# Patient Record
Sex: Male | Born: 1970 | Race: White | Hispanic: No | Marital: Married | Smoking: Never smoker
Health system: Southern US, Community
[De-identification: ages and names within clinical notes are randomized; demographics above are authoritative.]

## PROBLEM LIST (undated history)

## (undated) DIAGNOSIS — K219 Gastro-esophageal reflux disease without esophagitis: Secondary | ICD-10-CM

## (undated) DIAGNOSIS — L409 Psoriasis, unspecified: Secondary | ICD-10-CM

## (undated) DIAGNOSIS — M1990 Unspecified osteoarthritis, unspecified site: Secondary | ICD-10-CM

## (undated) DIAGNOSIS — I10 Essential (primary) hypertension: Secondary | ICD-10-CM

## (undated) DIAGNOSIS — E291 Testicular hypofunction: Secondary | ICD-10-CM

## (undated) HISTORY — DX: Psoriasis, unspecified: L40.9

## (undated) HISTORY — DX: Testicular hypofunction: E29.1

## (undated) HISTORY — PX: COLONOSCOPY: SHX174

## (undated) HISTORY — PX: OTHER SURGICAL HISTORY: SHX169

## (undated) HISTORY — DX: Morbid (severe) obesity due to excess calories: E66.01

## (undated) HISTORY — DX: Essential (primary) hypertension: I10

## (undated) HISTORY — DX: Gastro-esophageal reflux disease without esophagitis: K21.9

## (undated) HISTORY — PX: NO PAST SURGERIES: SHX2092

---

## 2001-09-01 ENCOUNTER — Encounter: Admission: RE | Admit: 2001-09-01 | Discharge: 2001-09-01 | Payer: Self-pay | Admitting: Occupational Medicine

## 2001-09-01 ENCOUNTER — Encounter: Payer: Self-pay | Admitting: Occupational Medicine

## 2005-04-24 ENCOUNTER — Emergency Department (HOSPITAL_COMMUNITY): Admission: EM | Admit: 2005-04-24 | Discharge: 2005-04-24 | Payer: Self-pay | Admitting: Family Medicine

## 2005-10-12 ENCOUNTER — Ambulatory Visit: Payer: Self-pay | Admitting: Family Medicine

## 2005-11-12 ENCOUNTER — Ambulatory Visit: Payer: Self-pay | Admitting: Family Medicine

## 2008-12-07 ENCOUNTER — Telehealth: Payer: Self-pay | Admitting: Family Medicine

## 2010-03-08 ENCOUNTER — Telehealth: Payer: Self-pay | Admitting: Family Medicine

## 2010-03-14 ENCOUNTER — Ambulatory Visit: Payer: Self-pay | Admitting: Family Medicine

## 2010-03-14 DIAGNOSIS — K219 Gastro-esophageal reflux disease without esophagitis: Secondary | ICD-10-CM | POA: Insufficient documentation

## 2010-03-14 DIAGNOSIS — N453 Epididymo-orchitis: Secondary | ICD-10-CM

## 2010-03-14 DIAGNOSIS — M109 Gout, unspecified: Secondary | ICD-10-CM | POA: Insufficient documentation

## 2010-03-14 DIAGNOSIS — M545 Low back pain: Secondary | ICD-10-CM

## 2010-03-29 ENCOUNTER — Telehealth: Payer: Self-pay | Admitting: Family Medicine

## 2010-03-29 DIAGNOSIS — N509 Disorder of male genital organs, unspecified: Secondary | ICD-10-CM | POA: Insufficient documentation

## 2010-04-05 ENCOUNTER — Encounter: Payer: Self-pay | Admitting: Family Medicine

## 2010-04-20 NOTE — Progress Notes (Signed)
Summary: referral to urologist   Phone Note Call from Patient   Caller: Patient Call For: Nelwyn Salisbury MD Summary of Call: [phone] Pt would like a referral to URO for an appt this week. He continues to have the same symptoms that he had on his last visit.   Initial call taken by: Lynann Beaver CMA AAMA,  March 29, 2010 10:43 AM  Follow-up for Phone Call        refer to Urology for low back pain and right testicle pain  Follow-up by: Nelwyn Salisbury MD,  March 30, 2010 10:23 AM  Additional Follow-up for Phone Call Additional follow up Details #1::        referral sent to terri  Additional Follow-up by: Pura Spice, RN,  March 30, 2010 11:51 AM  New Problems: TESTICULAR PAIN, RIGHT (ICD-608.9)   New Problems: TESTICULAR PAIN, RIGHT (ICD-608.9)

## 2010-04-20 NOTE — Progress Notes (Signed)
Summary: Pt req acute ov for Tues 12/27 for back pain and pain rt testicl  Phone Note Call from Patient Call back at [phone] cell   Caller: Patient Summary of Call: Pt called and said that he would like to sch and acute visit with Dr Clent Ridges on Tues 03/14/10 re: pain in lower back and in right testicle. Pt will be out of work that day. Pls advise.  Initial call taken by: Lucy Antigua,  March 08, 2010 1:58 PM  Follow-up for Phone Call        okay to schedule this Follow-up by: Nelwyn Salisbury MD,  March 08, 2010 5:14 PM  Additional Follow-up for Phone Call Additional follow up Details #1::        I called pt and sch him for acute ov for 12/27 at 8:30am, as noted above.  Additional Follow-up by: Lucy Antigua,  March 09, 2010 8:24 AM

## 2010-04-20 NOTE — Consult Note (Signed)
Summary: [facility]  [facility]   Imported By: Maryln Gottron 04/13/2010 13:32:57  _____________________________________________________________________  External Attachment:    Type:   Image     Comment:   External Document

## 2010-04-20 NOTE — Assessment & Plan Note (Signed)
Summary: lower back pain/pain in rt testicle/per Dr Fry/cjr   Vital Signs:  Patient profile:   40 year old male Height:      71.75 inches Weight:      312 pounds BMI:     42.76 Temp:     98.4 degrees F oral Pulse rate:   80 / minute Pulse rhythm:   regular Resp:     12 per minute BP sitting:   120 / 78  (left arm) Cuff size:   large  Vitals Entered By: Sid Falcon LPN (March 14, 2010 8:26 AM) CC: To re-establish Is Patient Diabetic? No   History of Present Illness: 40 yr old male to re-establish with [name] after an absence of 4 years. He has 2 complaints. First for 2 months he has had intermittent right testicular pain. This is mild, never severe. No urinary symptoms or fever. No penile DC. Also for one month he has had intermittent sharp right sided low back pain which radiates into both anterior thighs. No weakness or numbness. No recent trauma. He was diagnosed with gout about one year ago, and he has taken Indomethacin or prednisone for this. The gout usually hits his left foot and ankle. When this happens, the foot gets red and hot and swollen and very painful. Today it is fine. His [employer] involves him travelling all but 4 days a month, so he asks for a supply of gout meds for him to take with him.   Preventive Screening-Counseling & Management  Alcohol-Tobacco     Smoking Status: never  Allergies (verified): No Known Drug Allergies  Past History:  Past Medical History: morbid obesity, peaked at 425 lbs in 2006 chickenpox psoriasis since age 71 GERD Gout  Past Surgical History: Denies surgical history  Family History: Reviewed history and no changes required. Family History Diabetes 1st degree relative Family History Hypertension Family History of Stroke M 1st degree relative <50  Social History: Reviewed history and no changes required. Married Never Smoked Alcohol use-no Smoking Status:  never  Review of Systems  The patient denies anorexia, fever,  weight loss, weight gain, vision loss, decreased hearing, hoarseness, chest pain, syncope, dyspnea on exertion, peripheral edema, prolonged cough, headaches, hemoptysis, abdominal pain, melena, hematochezia, severe indigestion/heartburn, hematuria, incontinence, genital sores, muscle weakness, suspicious skin lesions, transient blindness, difficulty walking, depression, unusual weight change, abnormal bleeding, enlarged lymph nodes, angioedema, breast masses, and testicular masses.    Physical Exam  General:  overweight-appearing.  in NAD  Neck:  No deformities, masses, or tenderness noted. Lungs:  Normal respiratory effort, chest expands symmetrically. Lungs are clear to auscultation, no crackles or wheezes. Heart:  Normal rate and regular rhythm. S1 and S2 normal without gallop, murmur, click, rub or other extra sounds. Genitalia:  Testes bilaterally descended without nodularity  or masses. No scrotal masses or lesions. No penis lesions or urethral discharge. The right testicle is mildly tender  Msk:  No deformity or scoliosis noted of thoracic or lumbar spine.     Impression & Recommendations:  Problem # 1:  ORCHITIS (ICD-604.90)  Problem # 2:  GOUT, UNSPECIFIED (ICD-274.9)  Problem # 3:  BACK PAIN, LUMBAR (ICD-724.2)  Complete Medication List: 1)  Ciprofloxacin Hcl 500 Mg Tabs (Ciprofloxacin hcl) .... Two times a day 2)  Prednisone (pak) 10 Mg Tabs (Prednisone) .... As directed for 12 days 3)  Prednisone 10 Mg Tabs (Prednisone) .... As directed  Patient Instructions: 1)  Please schedule a follow-up appointment as needed .  Prescriptions: PREDNISONE 10 MG TABS (PREDNISONE) as directed  #100 x 0   Entered and Authorized by:   Nelwyn Salisbury MD   Signed by:   Nelwyn Salisbury MD on 03/14/2010   Method used:   Print then Give to Patient   RxID:   [id number] PREDNISONE (PAK) 10 MG TABS (PREDNISONE) as directed for 12 days  #1 x 0   Entered and Authorized by:   Nelwyn Salisbury MD    Signed by:   Nelwyn Salisbury MD on 03/14/2010   Method used:   [facility]  [name] [address] [facility]       [name]       [place], [place]  [address]       Ph: [phone] or [phone]       Fax: [phone]   RxID:   [id number] CIPROFLOXACIN HCL 500 MG TABS (CIPROFLOXACIN HCL) two times a day  #28 x 0   Entered and Authorized by:   Nelwyn Salisbury MD   Signed by:   Nelwyn Salisbury MD on 03/14/2010   Method used:   [facility]        [facility]  [name] [address]       [name]       [place], [place]  [address]       Ph: [phone] or [phone]       Fax: [phone]   RxID:   [id number]    Orders Added: 1)  New Patient Level IV [26948]

## 2010-08-04 NOTE — Assessment & Plan Note (Signed)
[facility]                              [facility] OFFICE NOTE   Steven, Solis                       MRN:          [id number]  DATE:10/12/2005                            DOB:          1970-04-13    This is a 40 year old gentleman here to establish with our practice,  primarily complaining of painful feet.  Says this all started about six  months ago, and he has had continuous trouble ever since then.  He describes  pain and swelling in his left heel, intermittent pain in the balls of both  of his feet, and intermittent pain in various toes on both feet.  He wears  supportive work boots to work.  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  He takes Aleve at home, which provides transient relief with the pain.  He  has a history of having had a gouty episode in the right third finger in the  past.  Also, one episode in his left ankle in the past but has not had gouty  type episodes for several years.  He does have a history of psoriasis.  He  sees Dr. Lonni Fix for care of this.  The psoriasis involves areas on his arms,  legs, trunk, and even the tops of his feet.  Dr. Lonni Fix told him his joint  pain could be related to the psoriasis and had him see a primary care  physician about it.   PAST MEDICAL HISTORY:  A lifelong struggle with his weight.  He had a peak  weight of about 425 pounds about a year ago.  He then got on an aggressive  diet and exercise plan and joined [employer] for about six months.  He was  able to lose weight down to 240 pounds, now over the past six months, he has  crept back up to his present weight of about 278 pounds.  Interestingly, it  was just after stopping the [employer] program that his foot pain began.  He had chickenpox as a child.  He has GE reflux disease, which is controlled  with diet.  He has never had a surgery.   IMMUNIZATIONS:  He  had a tetanus booster in 2003.   ALLERGIES:  None.   CURRENT MEDICATIONS:  None by prescription but he does use Aleve as needed.   HABITS:  He does not use tobacco or alcohol.   SOCIAL HISTORY:  He is married.  On his job, he operates heavy equipment.   FAMILY HISTORY:  Remarkable for strokes, high blood pressure, and diabetes.   OBJECTIVE:  VITAL SIGNS:  Height 5 foot 11 inches.  Weight 279.  BP 130/80,  pulse 76 and regular.  GENERAL:  He is quite overweight.  SKIN:  Remarkable for small patches of scaly red skin, consistent with  psoriasis over the trunk, arms, legs, and dorsa of his feet.  JOINTS:  Some tenderness over the balls of both of his feet and several of  his toes, but there is no obvious  swelling or discoloration.  He does have  fungal involvement with all 10 of his toenails, but interestingly, no  pitting of his fingernails or toenails is visible.  NECK:  Supple without lymphadenopathy or thyromegaly.  LUNGS:  Clear.  CARDIAC:  Regular rate and rhythm without murmurs, rubs or gallops.  Distal  pulses are full.   ASSESSMENT/PLAN:  Bilateral foot pain, consistent either with gouty  arthritis versus psoriatic arthritis.  We will begin treatment with Lodine  500 mg b.i.d.  I encouraged to continue with his weight loss efforts but to  try to avoid exercises that would not involve walking and running, such as  riding a stationary bicycle, operating an elliptical machine, swimming, etc.  Will also get some screening lab work today, including ANA, sed rate,  rheumatoid factor, and uric acid level.                                   Tera Mater. Clent Ridges, MD   SAF/MedQ  DD:  10/12/2005  DT:  10/12/2005  Job #:  [id number]

## 2011-03-09 ENCOUNTER — Encounter: Payer: Self-pay | Admitting: Family Medicine

## 2011-03-09 ENCOUNTER — Ambulatory Visit (INDEPENDENT_AMBULATORY_CARE_PROVIDER_SITE_OTHER): Payer: PRIVATE HEALTH INSURANCE | Admitting: Family Medicine

## 2011-03-09 DIAGNOSIS — L409 Psoriasis, unspecified: Secondary | ICD-10-CM

## 2011-03-09 DIAGNOSIS — M109 Gout, unspecified: Secondary | ICD-10-CM

## 2011-03-09 DIAGNOSIS — E291 Testicular hypofunction: Secondary | ICD-10-CM

## 2011-03-09 DIAGNOSIS — L408 Other psoriasis: Secondary | ICD-10-CM

## 2011-03-09 LAB — URIC ACID: Uric Acid, Serum: 7.7 mg/dL (ref 4.0–7.8)

## 2011-03-09 MED ORDER — PREDNISONE 10 MG PO TABS: 10.0000 mg | ORAL_TABLET | Freq: Every day | ORAL | Status: DC

## 2011-03-09 MED ORDER — CALCIPOTRIENE-BETAMETH DIPROP 0.005-0.064 % EX OINT: 1.0000 "application " | TOPICAL_OINTMENT | CUTANEOUS | Status: DC | PRN

## 2011-03-09 NOTE — Progress Notes (Signed)
  Subjective:    Patient ID: Steven Solis, male    DOB: Sep 03, 1970, 40 y.o.   MRN: [id number]  HPI Here for several reasons. He needs a refill on his Prednisone, which he uses for gout flares. He just finished a bottle of 100 prednisone pills in the course of one year. The gout involves his feet and hands primarily. He asks me to refill his Taclonex since it is hard for him to get in to see his Dermatologist. Also he had been seeing Urology for hypogonadism, but Dr. Margarita Grizzle is concerned about other abnormalities like his lipid levels. He asked that we refer him to Endocrine for this.    Review of Systems  Constitutional: Negative.   Respiratory: Negative.   Cardiovascular: Negative.   Musculoskeletal: Positive for joint swelling and arthralgias.       Objective:   Physical Exam  Constitutional: He appears well-developed and well-nourished.  Cardiovascular: Normal rate, regular rhythm, normal heart sounds and intact distal pulses.   Pulmonary/Chest: Effort normal and breath sounds normal.  Lymphadenopathy:    He has no cervical adenopathy.          Assessment & Plan:  Refilled Prednisone. Check a uric acid level. Refer to Endocrine as above

## 2011-03-12 ENCOUNTER — Telehealth: Payer: Self-pay | Admitting: *Deleted

## 2011-03-12 MED ORDER — ALLOPURINOL 300 MG PO TABS: 300.0000 mg | ORAL_TABLET | Freq: Every day | ORAL | Status: DC

## 2011-03-12 NOTE — Telephone Encounter (Signed)
Message copied by Trenton Gammon on Mon Mar 12, 2011 12:38 PM ------      Message from: Kern Reap B      Created: Mon Mar 12, 2011 12:38 PM       Left message on machine for patient  With lab result

## 2011-03-16 ENCOUNTER — Telehealth: Payer: Self-pay | Admitting: Family Medicine

## 2011-03-16 NOTE — Telephone Encounter (Signed)
rx was called in on 03/12/11 for  calcipotriene-betamethasone (TACLONEX) ointment Insurance will not cover the ointment pt said rx has to say topical suspension. Any questions please contact pt.  [id number] [place] [place] - [address] AT [place] [address]

## 2011-03-19 ENCOUNTER — Encounter: Payer: Self-pay | Admitting: Family Medicine

## 2011-03-19 ENCOUNTER — Ambulatory Visit (INDEPENDENT_AMBULATORY_CARE_PROVIDER_SITE_OTHER): Payer: PRIVATE HEALTH INSURANCE | Admitting: Family Medicine

## 2011-03-19 VITALS — BP 130/86 | HR 78 | Temp 98.2°F | Wt 330.0 lb

## 2011-03-19 DIAGNOSIS — J329 Chronic sinusitis, unspecified: Secondary | ICD-10-CM

## 2011-03-19 MED ORDER — CALCIPOTRIENE-BETAMETH DIPROP 0.005-0.064 % EX SUSP: Freq: Every day | CUTANEOUS | Status: DC

## 2011-03-19 MED ORDER — CEFUROXIME AXETIL 500 MG PO TABS: 500.0000 mg | ORAL_TABLET | Freq: Two times a day (BID) | ORAL | Status: AC

## 2011-03-19 NOTE — Telephone Encounter (Signed)
Script changed and called in.

## 2011-03-21 ENCOUNTER — Encounter: Payer: Self-pay | Admitting: Family Medicine

## 2011-03-21 NOTE — Progress Notes (Signed)
  Subjective:    Patient ID: Steven Solis, male    DOB: April 21, 1970, 41 y.o.   MRN: [id number]  HPI Here for one week of stuffy head, PND, HA, ST, and a dry cough.    Review of Systems  Constitutional: Negative.   HENT: Positive for congestion, sore throat and postnasal drip.   Eyes: Negative.   Respiratory: Positive for cough.        Objective:   Physical Exam  Constitutional: He appears well-developed and well-nourished.  HENT:  Right Ear: External ear normal.  Left Ear: External ear normal.  Nose: Nose normal.  Mouth/Throat: Oropharynx is clear and moist. No oropharyngeal exudate.  Eyes: Conjunctivae are normal.  Neck: No thyromegaly present.  Pulmonary/Chest: Effort normal and breath sounds normal.  Lymphadenopathy:    He has no cervical adenopathy.          Assessment & Plan:  Add Mucinex

## 2011-08-07 ENCOUNTER — Encounter (HOSPITAL_COMMUNITY): Payer: Self-pay | Admitting: *Deleted

## 2011-08-07 ENCOUNTER — Emergency Department (HOSPITAL_COMMUNITY)
Admission: EM | Admit: 2011-08-07 | Discharge: 2011-08-07 | Disposition: A | Discharge status: Home / Self Care | Payer: Worker's Compensation | Attending: Emergency Medicine | Admitting: Emergency Medicine

## 2011-08-07 DIAGNOSIS — K219 Gastro-esophageal reflux disease without esophagitis: Secondary | ICD-10-CM | POA: Insufficient documentation

## 2011-08-07 DIAGNOSIS — S61509A Unspecified open wound of unspecified wrist, initial encounter: Secondary | ICD-10-CM | POA: Insufficient documentation

## 2011-08-07 DIAGNOSIS — Y9289 Other specified places as the place of occurrence of the external cause: Secondary | ICD-10-CM | POA: Insufficient documentation

## 2011-08-07 DIAGNOSIS — W260XXA Contact with knife, initial encounter: Secondary | ICD-10-CM | POA: Insufficient documentation

## 2011-08-07 DIAGNOSIS — S61512A Laceration without foreign body of left wrist, initial encounter: Secondary | ICD-10-CM

## 2011-08-07 DIAGNOSIS — Y990 Civilian activity done for income or pay: Secondary | ICD-10-CM | POA: Insufficient documentation

## 2011-08-07 HISTORY — DX: Unspecified osteoarthritis, unspecified site: M19.90

## 2011-08-07 MED ORDER — NAPROXEN 500 MG PO TABS: 500.0000 mg | ORAL_TABLET | Freq: Two times a day (BID) | ORAL | Status: AC

## 2011-08-07 MED ORDER — TETANUS-DIPHTH-ACELL PERTUSSIS 5-2.5-18.5 LF-MCG/0.5 IM SUSP
INTRAMUSCULAR | Status: AC
  Filled 2011-08-07: qty 0.5

## 2011-08-07 MED ORDER — TETANUS-DIPHTH-ACELL PERTUSSIS 5-2.5-18.5 LF-MCG/0.5 IM SUSP
0.5000 mL | Freq: Once | INTRAMUSCULAR | Status: AC
  Administered 2011-08-07: 0.5 mL via INTRAMUSCULAR

## 2011-08-07 NOTE — Discharge Instructions (Signed)
Keep wound dry, and dressing in place for least 24 hours after that can shower.  And redressed.  The wound with Neosporin or bacitracin ointment, and a new dressing.  Staple removal in 7-10 days.  Return for any signs of infection.

## 2011-08-07 NOTE — ED Provider Notes (Signed)
History   This chart was scribed for Steven Jakes, MD by Steven Solis. The patient was seen in room STRE4/STRE4. Patient's care was started at 1456.   CSN: [id number]  Arrival date & time 08/07/11  1456   First MD Initiated Contact with Patient 08/07/11 1639      Chief Complaint  Patient presents with  . Laceration    left wrist    (Consider location/radiation/quality/duration/timing/severity/associated sxs/prior treatment) HPI  Steven Solis is a 41 y.o. male who presents to the Emergency Department complaining of a left forearm laceration that occurred two hours ago. Pt is right hand dominate. Pt cut left forearm at work with box-cutter knife. Pt received tetanus shot today at hospital. Denies numbness in the fingers.    Past Medical History  Diagnosis Date  . Morbid obesity     peaked at 425 lbs in 2006  . Psoriasis     since age 40  . GERD (gastroesophageal reflux disease)   . Gout   . Hypogonadism male     sees Dr. Margarita Grizzle   . Arthritis     Past Surgical History  Procedure Date  . No past surgeries     Family History  Problem Relation Age of Onset  . Diabetes    . Hypertension    . Stroke      History  Substance Use Topics  . Smoking status: Never Smoker   . Smokeless tobacco: Never Used  . Alcohol Use: No      Review of Systems  Constitutional: Negative for fever and chills.  HENT: Negative for sore throat and neck pain.   Respiratory: Negative for shortness of breath.   Gastrointestinal: Negative for nausea, vomiting and diarrhea.  All other systems reviewed and are negative.    Allergies  Codeine  Home Medications   Current Outpatient Rx  Name Route Sig Dispense Refill  . METHOTREXATE SODIUM CHEMO INJECTION 25 MG/ML Subcutaneous Inject 25 mg into the skin once a week. Tuesday's at bedtime    . VARDENAFIL HCL 20 MG PO TABS Oral Take 20 mg by mouth daily as needed. For E.D.    . NAPROXEN 500 MG PO TABS Oral Take 1 tablet (500  mg total) by mouth 2 (two) times daily. 14 tablet 0    BP 148/107  Pulse 91  Temp(Src) 97.7 F (36.5 C) (Oral)  Resp 20  SpO2 96%  Physical Exam  Nursing note and vitals reviewed. Constitutional: He is oriented to person, place, and time. He appears well-developed and well-nourished.  HENT:  Head: Normocephalic and atraumatic.  Eyes: Conjunctivae and EOM are normal. Pupils are equal, round, and reactive to light.  Neck: Normal range of motion. Neck supple.  Cardiovascular: Normal rate and regular rhythm.   Pulmonary/Chest: Effort normal and breath sounds normal.  Abdominal: Soft. Bowel sounds are normal.  Musculoskeletal: Normal range of motion.       2.5cm laceration left forearm. Good ROM, Good strength, Cap refill 1 second.   Neurological: He is alert and oriented to person, place, and time.  Skin: Skin is warm and dry.  Psychiatric: He has a normal mood and affect.    ED Course  Procedures (including critical care time)  LACERATION REPAIR PROCEDURE NOTE The patient's identification was confirmed and consent was obtained. This procedure was performed by Steven Jakes, MD at 5:26 PM. Site: left forearm     Sterile procedures observed Anesthetic used (type and amt): lidocaine 2%, 2cc.  Suture type/size:staples Length:2.5 cm  # of Sutures: 5 Technique:sterile technique, irrigated with normal saline.  Complexity, complicated  Antibx ointment applied Tetanus UTD Site anesthetized, irrigated with NS, explored without evidence of foreign body, no tendon involvement, wound well approximated, site covered with dry, sterile dressing.  Patient tolerated procedure well without complications. Instructions for care discussed verbally and patient provided with additional written instructions for homecare and f/u.   Pt seen at 1648  Labs Reviewed - No data to display No results found.   1. Wrist laceration, left, initial encounter       MDM  Laceration to left  wrist with a box cutter.  Laceration about 20 cm long.  Patient with good range of motion of all fingers, particularly the fingers.  Grip, the fifth and fourth finger.  Sensations intact.  Exploration of the wound shows no evidence of tendon involvement.  Wound irrigated and closed with surgical staples.  Patient's tetanus was updated in the emergency department.     I personally performed the services described in this documentation, which was scribed in my presence. The recorded information has been reviewed and considered.     Steven Jakes, MD 08/07/11 [phone]

## 2011-08-07 NOTE — ED Notes (Signed)
Pt accidentally cut his left lateral wrist with box cutter.  Moves fingers well.  Bleeding controlled.  Last tetanus 5 years ago

## 2012-02-12 ENCOUNTER — Ambulatory Visit (INDEPENDENT_AMBULATORY_CARE_PROVIDER_SITE_OTHER): Payer: PRIVATE HEALTH INSURANCE | Admitting: Family Medicine

## 2012-02-12 ENCOUNTER — Encounter: Payer: Self-pay | Admitting: Family Medicine

## 2012-02-12 VITALS — BP 136/90 | HR 76 | Temp 98.5°F | Wt 330.0 lb

## 2012-02-12 DIAGNOSIS — M545 Low back pain, unspecified: Secondary | ICD-10-CM

## 2012-02-12 MED ORDER — KETOROLAC TROMETHAMINE 60 MG/2ML IM SOLN
60.0000 mg | Freq: Once | INTRAMUSCULAR | Status: AC
  Administered 2012-02-12: 60 mg via INTRAMUSCULAR

## 2012-02-12 MED ORDER — VARDENAFIL HCL 20 MG PO TABS: 20.0000 mg | ORAL_TABLET | ORAL | Status: DC | PRN

## 2012-02-12 MED ORDER — TRAMADOL HCL 50 MG PO TABS: 100.0000 mg | ORAL_TABLET | Freq: Four times a day (QID) | ORAL | Status: DC | PRN

## 2012-02-12 MED ORDER — CYCLOBENZAPRINE HCL 10 MG PO TABS: 10.0000 mg | ORAL_TABLET | Freq: Three times a day (TID) | ORAL | Status: DC | PRN

## 2012-02-12 MED ORDER — METHYLPREDNISOLONE 4 MG PO KIT: PACK | ORAL | Status: AC

## 2012-02-12 NOTE — Progress Notes (Signed)
  Subjective:    Patient ID: Steven Solis, male    DOB: 1970-03-23, 41 y.o.   MRN: [id number]  HPI Here for severe low back pain and spasms which started 3 days ago as he was trying to put on some coveralls at work. He has sharp pains in the left lower back. No pain or numbness in the legs. Motrin and heat help a little.    Review of Systems  Constitutional: Negative.   Musculoskeletal: Positive for back pain.       Objective:   Physical Exam  Constitutional: He appears well-developed and well-nourished.       In pain, walks slowly   Musculoskeletal:       There is a tremendous amount of spasm in the lower back with decreased ROM . Negative SLR. Tender in the left lower back           Assessment & Plan:  Rest, heat. Use a Medrol dose pack for inflammation, Flexeril for spasm, and Tramadol for pain. Given a Toradol shot.  Recheck prn

## 2012-02-12 NOTE — Addendum Note (Signed)
Addended by: Aniceto Boss A on: 02/12/2012 01:03 PM   Modules accepted: Orders

## 2012-04-29 ENCOUNTER — Encounter: Payer: Self-pay | Admitting: Family Medicine

## 2012-04-29 ENCOUNTER — Ambulatory Visit: Payer: Self-pay | Admitting: Family Medicine

## 2012-04-29 NOTE — Progress Notes (Signed)
NO SHOW. This encounter was created in error - please disregard.

## 2012-04-30 ENCOUNTER — Ambulatory Visit: Payer: Self-pay | Admitting: Family Medicine

## 2012-06-30 ENCOUNTER — Ambulatory Visit (INDEPENDENT_AMBULATORY_CARE_PROVIDER_SITE_OTHER): Payer: 59 | Admitting: Family Medicine

## 2012-06-30 ENCOUNTER — Encounter: Payer: Self-pay | Admitting: Family Medicine

## 2012-06-30 VITALS — BP 140/86 | HR 98 | Temp 98.7°F | Wt 326.0 lb

## 2012-06-30 DIAGNOSIS — J019 Acute sinusitis, unspecified: Secondary | ICD-10-CM

## 2012-06-30 MED ORDER — METHYLPREDNISOLONE ACETATE 80 MG/ML IJ SUSP
120.0000 mg | Freq: Once | INTRAMUSCULAR | Status: AC
  Administered 2012-06-30: 120 mg via INTRAMUSCULAR

## 2012-06-30 MED ORDER — CEFUROXIME AXETIL 500 MG PO TABS: 500.0000 mg | ORAL_TABLET | Freq: Two times a day (BID) | ORAL | Status: DC

## 2012-06-30 MED ORDER — SILDENAFIL CITRATE 100 MG PO TABS: 100.0000 mg | ORAL_TABLET | Freq: Every day | ORAL | Status: DC | PRN

## 2012-06-30 NOTE — Addendum Note (Signed)
Addended by: Aniceto Boss A on: 06/30/2012 01:03 PM   Modules accepted: Orders

## 2012-06-30 NOTE — Progress Notes (Signed)
  Subjective:    Patient ID: Steven Solis, male    DOB: June 09, 1970, 42 y.o.   MRN: [id number]  HPI Here for one week of sinus pressure, PND, ST, and a dry cough. No fever. On Mucinex.    Review of Systems  Constitutional: Negative.   HENT: Positive for congestion, postnasal drip and sinus pressure.   Eyes: Negative.   Respiratory: Positive for cough.        Objective:   Physical Exam  Constitutional: He appears well-developed and well-nourished.  HENT:  Right Ear: External ear normal.  Left Ear: External ear normal.  Nose: Nose normal.  Mouth/Throat: Oropharynx is clear and moist.  Eyes: Conjunctivae are normal.  Pulmonary/Chest: Effort normal and breath sounds normal.  Lymphadenopathy:    He has no cervical adenopathy.          Assessment & Plan:  Given a steroid shot.

## 2012-07-07 ENCOUNTER — Telehealth: Payer: Self-pay | Admitting: Family Medicine

## 2012-07-07 MED ORDER — CEFUROXIME AXETIL 500 MG PO TABS: 500.0000 mg | ORAL_TABLET | Freq: Two times a day (BID) | ORAL | Status: DC

## 2012-07-07 NOTE — Telephone Encounter (Signed)
He needs a refill on antibiotics

## 2013-01-20 ENCOUNTER — Telehealth: Payer: Self-pay | Admitting: Family Medicine

## 2013-01-20 NOTE — Telephone Encounter (Signed)
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 Pt is in [place], [place] and will not be home until after Thanksgiving. Pt would like to have it done there. Pt needs to have this done before 02/12/13. Pt is not sure if he can call and schedule this or what should he do to get this done?

## 2013-05-18 ENCOUNTER — Telehealth: Payer: Self-pay | Admitting: Family Medicine

## 2013-05-18 NOTE — Telephone Encounter (Signed)
Error/njr °

## 2013-05-18 NOTE — Telephone Encounter (Signed)
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 Pt is in [place] and will not be home for awhile  . Pt would like to have it done there. Pt is not sure if he can call and schedule this or what should he do to get this done?

## 2013-05-18 NOTE — Telephone Encounter (Signed)
I do not order these, instead I refer to our Pulmonary doctors who order the test. I can do a referral to get this started but he will need to be here in [place] for this. Let me know

## 2013-05-18 NOTE — Telephone Encounter (Signed)
I left a voice message with the below information. 

## 2013-06-18 ENCOUNTER — Other Ambulatory Visit: Payer: Self-pay | Admitting: Family Medicine

## 2013-06-18 NOTE — Telephone Encounter (Signed)
Pt following up on rx for Viagra,  Would like another refill. [facility]

## 2013-07-07 ENCOUNTER — Other Ambulatory Visit: Payer: Self-pay | Admitting: Family Medicine

## 2014-05-06 ENCOUNTER — Encounter: Payer: Self-pay | Admitting: Family Medicine

## 2014-05-06 ENCOUNTER — Ambulatory Visit (INDEPENDENT_AMBULATORY_CARE_PROVIDER_SITE_OTHER): Payer: 59 | Admitting: Family Medicine

## 2014-05-06 VITALS — BP 137/73 | HR 71 | Temp 98.1°F | Ht 71.75 in | Wt 337.0 lb

## 2014-05-06 DIAGNOSIS — J321 Chronic frontal sinusitis: Secondary | ICD-10-CM

## 2014-05-06 MED ORDER — PHENTERMINE HCL 37.5 MG PO CAPS: 37.5000 mg | ORAL_CAPSULE | ORAL | Status: DC

## 2014-05-06 MED ORDER — AMOXICILLIN-POT CLAVULANATE 875-125 MG PO TABS: 1.0000 | ORAL_TABLET | Freq: Two times a day (BID) | ORAL | Status: DC

## 2014-05-06 NOTE — Progress Notes (Signed)
Pre visit review using our clinic review tool, if applicable. No additional management support is needed unless otherwise documented below in the visit note. 

## 2014-05-06 NOTE — Progress Notes (Signed)
   Subjective:    Patient ID: Steven Solis, male    DOB: 12/01/1970, 44 y.o.   MRN: [id number]  HPI Here for intermittent sinus issues for the past 3 months. He has sinus pressure, HAs, PND, and blows green mucus from the nose. No fever or coughing. Using Mucinex D   Review of Systems  Constitutional: Negative.   HENT: Positive for congestion, postnasal drip and sinus pressure.   Eyes: Negative.   Respiratory: Negative.        Objective:   Physical Exam  Constitutional: He appears well-developed and well-nourished.  HENT:  Right Ear: External ear normal.  Left Ear: External ear normal.  Nose: Nose normal.  Mouth/Throat: Oropharynx is clear and moist.  Eyes: Conjunctivae are normal.  Pulmonary/Chest: Effort normal and breath sounds normal.  Lymphadenopathy:    He has no cervical adenopathy.          Assessment & Plan:  Try Augmentin

## 2014-05-28 ENCOUNTER — Telehealth: Payer: Self-pay | Admitting: Family Medicine

## 2014-05-28 ENCOUNTER — Ambulatory Visit: Payer: Self-pay | Admitting: Family Medicine

## 2014-05-28 NOTE — Telephone Encounter (Signed)
Pt has developed a cough. Pt has 3  teenager  All was dx today with flu. Pt would like tamiflu call into [facility] [place]

## 2014-05-28 NOTE — Telephone Encounter (Signed)
Pt must be seen, can schedule appointment.

## 2014-05-28 NOTE — Telephone Encounter (Signed)
Pt decline to sch appt

## 2014-06-29 ENCOUNTER — Telehealth: Payer: Self-pay | Admitting: Family Medicine

## 2014-06-29 NOTE — Telephone Encounter (Signed)
Pt request refill VIAGRA 100 MG tablet [facility]

## 2014-06-29 NOTE — Telephone Encounter (Signed)
Call in #10 with 11 rf 

## 2014-07-01 MED ORDER — SILDENAFIL CITRATE 100 MG PO TABS: ORAL_TABLET | ORAL | Status: DC

## 2014-07-01 NOTE — Telephone Encounter (Signed)
I sent script e-scribe. 

## 2014-12-03 ENCOUNTER — Telehealth: Payer: Self-pay | Admitting: Family Medicine

## 2014-12-03 NOTE — Telephone Encounter (Signed)
Pt request refill phentermine 37.5 MG capsule  [facility]

## 2014-12-06 NOTE — Telephone Encounter (Signed)
Refill for 6 months. 

## 2014-12-07 MED ORDER — PHENTERMINE HCL 37.5 MG PO CAPS: 37.5000 mg | ORAL_CAPSULE | ORAL | Status: DC

## 2014-12-07 NOTE — Telephone Encounter (Signed)
I called in script 

## 2015-03-22 ENCOUNTER — Encounter: Payer: Self-pay | Admitting: Family Medicine

## 2015-03-22 ENCOUNTER — Ambulatory Visit (INDEPENDENT_AMBULATORY_CARE_PROVIDER_SITE_OTHER): Payer: 59 | Admitting: Family Medicine

## 2015-03-22 VITALS — BP 142/104 | HR 78 | Temp 98.3°F | Ht 71.75 in | Wt 319.0 lb

## 2015-03-22 DIAGNOSIS — J019 Acute sinusitis, unspecified: Secondary | ICD-10-CM | POA: Diagnosis not present

## 2015-03-22 DIAGNOSIS — M545 Low back pain, unspecified: Secondary | ICD-10-CM

## 2015-03-22 DIAGNOSIS — L4050 Arthropathic psoriasis, unspecified: Secondary | ICD-10-CM

## 2015-03-22 MED ORDER — AZITHROMYCIN 250 MG PO TABS: ORAL_TABLET | ORAL | Status: DC

## 2015-03-22 MED ORDER — TRAMADOL HCL 50 MG PO TABS: 100.0000 mg | ORAL_TABLET | Freq: Four times a day (QID) | ORAL | Status: DC | PRN

## 2015-03-22 MED ORDER — CYCLOBENZAPRINE HCL 10 MG PO TABS: 10.0000 mg | ORAL_TABLET | Freq: Three times a day (TID) | ORAL | Status: DC | PRN

## 2015-03-22 NOTE — Progress Notes (Signed)
Pre visit review using our clinic review tool, if applicable. No additional management support is needed unless otherwise documented below in the visit note. 

## 2015-03-22 NOTE — Progress Notes (Signed)
   Subjective:    Patient ID: Steven Solis, male    DOB: 02/05/1971, 45 y.o.   MRN: [id number]  HPI Here for several things. First one week ago he twisted his back while putting up a fence. He has had stiffness and pain in the left lower back since then. No radiation to the legs. Using heat and Tramadol. Also he asks for advice about his psoriatic arthritis. He had seen Dr. Azzie RoupShane Anderson in the past but now that he left the practice Steven Solis is not sure who to see. Also he has had one week of sinus pressure, PND, and coughing up green sputum. No fever.    Review of Systems  Constitutional: Negative.   HENT: Positive for congestion, postnasal drip and sinus pressure. Negative for ear pain and sore throat.   Eyes: Negative.   Respiratory: Positive for cough.   Cardiovascular: Negative.   Musculoskeletal: Positive for back pain and arthralgias.       Objective:   Physical Exam  Constitutional: He appears well-developed and well-nourished.  In pain  HENT:  Right Ear: External ear normal.  Left Ear: External ear normal.  Nose: Nose normal.  Mouth/Throat: Oropharynx is clear and moist.  Eyes: Conjunctivae are normal.  Neck: No thyromegaly present.  Cardiovascular: Normal rate, regular rhythm, normal heart sounds and intact distal pulses.   Pulmonary/Chest: Effort normal and breath sounds normal.  Musculoskeletal:  He has tenderness and spasm in the left lower back. Full ROM , negative SLR  Lymphadenopathy:    He has no cervical adenopathy.          Assessment & Plan:  For the psoriatic arthritis, we will refer [facility] to [facility]. For the back strain he can use Flexeril and Tramadol. For the sinusitis, he will use a Zpack.

## 2015-06-24 ENCOUNTER — Telehealth: Payer: Self-pay | Admitting: *Deleted

## 2015-06-24 NOTE — Telephone Encounter (Signed)
Left message on voicemail to call office. Please tell pt Viagra has been approved, effective 05/24/2015 through 06/22/2016.

## 2015-06-28 NOTE — Telephone Encounter (Signed)
Left message on personal voicemail Rx for Viagra has been approved and go to pharmacy and pickup Rx. Any questions please call office.

## 2015-08-13 ENCOUNTER — Other Ambulatory Visit: Payer: Self-pay | Admitting: Family Medicine

## 2015-08-16 ENCOUNTER — Other Ambulatory Visit: Payer: Self-pay | Admitting: General Practice

## 2015-08-16 ENCOUNTER — Telehealth: Payer: Self-pay | Admitting: General Practice

## 2015-08-16 MED ORDER — SILDENAFIL CITRATE 100 MG PO TABS: ORAL_TABLET | ORAL | Status: DC

## 2015-08-16 NOTE — Telephone Encounter (Signed)
Call in #10 with 11 rf 

## 2015-10-12 ENCOUNTER — Telehealth: Payer: Self-pay | Admitting: Family Medicine

## 2015-10-12 DIAGNOSIS — L4050 Arthropathic psoriasis, unspecified: Secondary | ICD-10-CM

## 2015-10-12 NOTE — Telephone Encounter (Signed)
Pt would like to be referred to Dr. Tawana Scale office the rheumatologist.

## 2015-10-13 NOTE — Telephone Encounter (Signed)
Referral was done  

## 2015-10-14 NOTE — Telephone Encounter (Signed)
I tried to reach pt by phone and mail box was full. 

## 2015-10-18 NOTE — Telephone Encounter (Signed)
I left a voice message with below message.  

## 2015-10-18 NOTE — Telephone Encounter (Signed)
Pt aware that referral was placed. However, pt is out of the med Adalimumab (HUMIRA PEN [place])  2 pens /mo   Pt would like to know if Dr Clent Ridges will refill until he can see Dr Dierdre Forth.  [facility]

## 2015-10-18 NOTE — Telephone Encounter (Signed)
No this is not a medication that I prescribe. He will need to see Dr. Dierdre Forth for this.

## 2015-12-26 ENCOUNTER — Ambulatory Visit (INDEPENDENT_AMBULATORY_CARE_PROVIDER_SITE_OTHER): Payer: Self-pay | Admitting: Family Medicine

## 2015-12-26 VITALS — BP 142/98 | HR 69 | Temp 98.2°F | Resp 16 | Ht 71.75 in | Wt 337.0 lb

## 2015-12-26 DIAGNOSIS — Z024 Encounter for examination for driving license: Secondary | ICD-10-CM

## 2015-12-26 NOTE — Progress Notes (Signed)
By signing my name below I, Steven Solis, attest that this documentation has been prepared under the direction and in the presence of Shade FloodJeffrey R Willy Pinkerton, MD. Electonically Signed. Steven Solis, Scribe 12/26/2015 at 9:37 AM  Subjective:    Patient ID: Steven Solis, male    DOB: 09/11/1970, 45 y.o.   MRN: [id number]  Chief Complaint  Patient presents with  . Employment Physical    dot    HPI Steven Solis is a 45 y.o. male who presents to the [facility] for DOT physical.  Pt has a history of obesity, Gout, and psoriatic arthritis. Pt is followed by rheumatologist. Pt takes humira injections to control his psoriatic arthritis. Pt denies having any gout flares since he started taking humira. Pt has not had a psoriatic arthritis flare in 3-4 years. Pt denies any side effects from taking humira. Pt denies any joint pains, muscle pains, or muscle weakness. Pt denies any back pain.   Pt has had a left knee surgery earlier this year. Pt has full ROM and full strength in his left knee at this time.   Pt was prescribed phentermine for his obesity in the past. Pt states that he discontinued his phentermine prior to his knee surgery and has not started it back again.   Pt was [facility] and [facility] at a [facility] office on 10/17/15 for a DOT physical. At that time pt was given a 3 month DOT card and told he needs to have a sleep study done to rule out OSA. DOT card given by  Steven Solis.  Pt denies having any daytime fatigue, waking up in the night, trouble sleeping, or history of snoring.    Visual Acuity Screening   Right eye Left eye Both eyes  Without correction: 20/20 20/20 20/15   With correction:     Comments: Titmus 85% Colors 6/6  Hearing Screening Comments: Whisper 9 ft    Patient Active Problem List   Diagnosis Date Noted  . Psoriatic arthritis (HCC) 03/22/2015  . TESTICULAR PAIN, RIGHT 03/29/2010  . Gout, unspecified 03/14/2010  . GERD 03/14/2010  .  ORCHITIS 03/14/2010  . BACK PAIN, LUMBAR 03/14/2010   Past Medical History:  Diagnosis Date  . Arthritis    psoriatic  . GERD (gastroesophageal reflux disease)   . Gout   . Hypogonadism male    sees Dr. Margarita GrizzleWoodruff   . Morbid obesity (HCC)    peaked at 425 lbs in 2006  . Psoriasis    since age 45   Past Surgical History:  Procedure Laterality Date  . NO PAST SURGERIES     Allergies  Allergen Reactions  . Codeine     Caused vomitting   Prior to Admission medications   Medication Sig Start Date End Date Taking? Authorizing Provider  Adalimumab (HUMIRA PEN [place]) Inject into the skin. Once every two weeks   Yes Historical Provider, MD  sildenafil (VIAGRA) 100 MG tablet TAKE 1 TABLET BY MOUTH EVERY DAY AS NEEDED FOR ERECTILE DYSFUNTION 08/16/15  Yes Nelwyn SalisburyStephen A Fry, MD   Social History   Social History  . Marital status: Married    Spouse name: N/A  . Number of children: N/A  . Years of education: N/A   Occupational History  . Not on file.   Social History Main Topics  . Smoking status: Never Smoker  . Smokeless tobacco: Never Used  . Alcohol use No  . Drug use: No  . Sexual activity:  Not on file   Other Topics Concern  . Not on file   Social History Narrative   Married            Review of Systems 32 question ROS for DOT form all marked negative.     Objective:   Physical Exam  Constitutional: He is oriented to person, place, and time. He appears well-developed and well-nourished.  HENT:  Head: Normocephalic and atraumatic.  Right Ear: External ear normal.  Left Ear: External ear normal.  Mouth/Throat: Oropharynx is clear and moist.  Eyes: Conjunctivae and EOM are normal. Pupils are equal, round, and reactive to light.  Neck: Normal range of motion. Neck supple. No thyromegaly present.  Cardiovascular: Normal rate, regular rhythm, normal heart sounds and intact distal pulses.   Pulmonary/Chest: Effort normal and breath sounds normal. No respiratory  distress. He has no wheezes.  Abdominal: Soft. He exhibits no distension. There is no tenderness. Hernia confirmed negative in the right inguinal area and confirmed negative in the left inguinal area.  Musculoskeletal: Normal range of motion. He exhibits no edema or tenderness.  Lymphadenopathy:    He has no cervical adenopathy.  Neurological: He is alert and oriented to person, place, and time. He has normal reflexes.  Skin: Skin is warm and dry.  Psychiatric: He has a normal mood and affect. His behavior is normal.  Vitals reviewed.   Vitals:   12/26/15 0903  BP: (!) 148/92  Pulse: 69  Resp: 16  Temp: 98.2 F (36.8 C)  TempSrc: Oral  SpO2: 98%  Weight: (!) 337 lb (152.9 kg)  Height: 5' 11.75" (1.822 m)  Body mass index is 46.02 kg/m.       Assessment & Plan:  Steven Solis is a 45 y.o. male Encounter for commercial driver medical examination (CDME) History of psoriatic arthritis, well-controlled, no recent flare. History of left knee surgery, also doing well. Obese with BMI of 46, and had received a 3 month card by another provider in July to allow time for sleep study. He is not yet had this done. Advised him of the importance of having this done in order to complete certification.   Also needs to have BP rechecked by PCP as borderline today.   No orders of the defined types were placed in this encounter.  Patient Instructions   I am unable to provide a DOT card today as you are currently within your 3 month card from other provider and sleep study is needed to rule out obstructive sleep apnea. Additionally your blood pressure was slightly elevated today over the requirement of 140/90 based on DOT standards. Follow-up with your primary care provider to address the blood pressure, and once you have a sleep study, follow-up with previous provider or you can return here to discuss certification at that time.   IF you received an x-ray today, you will receive an invoice from  [facility]. Please contact [facility] at [phone] with questions or concerns regarding your invoice.   IF you received labwork today, you will receive an invoice from [employer]. Please contact [facility] at [phone] with questions or concerns regarding your invoice.   Our billing staff will not be able to assist you with questions regarding bills from these companies.  You will be contacted with the lab results as soon as they are available. The fastest way to get your results is to activate your My Chart account. Instructions are located on the last page of  this paperwork. If you have not heard from [name] regarding the results in 2 weeks, please contact this office.       I personally performed the services described in this documentation, which was scribed in my presence. The recorded information has been reviewed and considered, and addended by me as needed.   Signed,   Meredith Staggers, MD [facility].  12/26/15 9:50 AM

## 2015-12-26 NOTE — Patient Instructions (Addendum)
I am unable to provide a DOT card today as you are currently within your 3 month card from other provider and sleep study is needed to rule out obstructive sleep apnea. Additionally your blood pressure was slightly elevated today over the requirement of 140/90 based on DOT standards. Follow-up with your primary care provider to address the blood pressure, and once you have a sleep study, follow-up with previous provider or you can return here to discuss certification at that time.   IF you received an x-ray today, you will receive an invoice from [facility]. Please contact [facility] at [phone] with questions or concerns regarding your invoice.   IF you received labwork today, you will receive an invoice from [facility]. Please contact [facility] at [phone] with questions or concerns regarding your invoice.   Our billing staff will not be able to assist you with questions regarding bills from these companies.  You will be contacted with the lab results as soon as they are available. The fastest way to get your results is to activate your My Chart account. Instructions are located on the last page of this paperwork. If you have not heard from us regarding the results in 2 weeks, please contact this office.

## 2015-12-27 ENCOUNTER — Encounter: Payer: Self-pay | Admitting: Family Medicine

## 2015-12-27 ENCOUNTER — Ambulatory Visit (INDEPENDENT_AMBULATORY_CARE_PROVIDER_SITE_OTHER): Payer: 59 | Admitting: Family Medicine

## 2015-12-27 VITALS — BP 148/90 | Temp 98.2°F | Ht 71.75 in | Wt 330.0 lb

## 2015-12-27 DIAGNOSIS — S39012A Strain of muscle, fascia and tendon of lower back, initial encounter: Secondary | ICD-10-CM

## 2015-12-27 DIAGNOSIS — G4733 Obstructive sleep apnea (adult) (pediatric): Secondary | ICD-10-CM | POA: Diagnosis not present

## 2015-12-27 MED ORDER — CYCLOBENZAPRINE HCL 10 MG PO TABS: 10.0000 mg | ORAL_TABLET | Freq: Three times a day (TID) | ORAL | 2 refills | Status: DC | PRN

## 2015-12-27 MED ORDER — PHENTERMINE HCL 37.5 MG PO TABS: 37.5000 mg | ORAL_TABLET | Freq: Every day | ORAL | 1 refills | Status: DC

## 2015-12-27 NOTE — Progress Notes (Signed)
   Subjective:    Patient ID: Steven Solis, male    DOB: 12/31/1970, 45 y.o.   MRN: [id number]  HPI Here for several issues. First he asks for a sleep study. 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 He says he does not snore and does not wake up gasping for air. He admits to putting on a lot of weight however. He wants to try Phentermine again to lose weight, since he was successful with this a few years ago. Lastly he pulled a muscle in the lower back yesterday while blowing leaves in his yard. The pain is across both sides of the lower back but does not radiate into the legs.    Review of Systems  Constitutional: Positive for unexpected weight change.  Respiratory: Negative.   Cardiovascular: Negative.   Musculoskeletal: Positive for back pain.  Neurological: Negative.        Objective:   Physical Exam  Constitutional: He is oriented to person, place, and time.  Morbidly obese, in some pain  Neck: No thyromegaly present.  Cardiovascular: Normal rate, regular rhythm, normal heart sounds and intact distal pulses.   Pulmonary/Chest: Effort normal and breath sounds normal.  Musculoskeletal:  Tender in the lower back, full ROM   Lymphadenopathy:    He has no cervical adenopathy.  Neurological: He is alert and oriented to person, place, and time.          Assessment & Plan:  Lower back strain, use heat, Ibuprofen and Flexeril as needed. Refer for a sleep study, as I feel it likely he does have some sleep apnea. Wrote for Phentermine to help lose weight.  Nelwyn SalisburyFRY,Claudean Leavelle A, MD

## 2015-12-27 NOTE — Progress Notes (Signed)
Pre visit review using our clinic review tool, if applicable. No additional management support is needed unless otherwise documented below in the visit note. 

## 2016-01-10 ENCOUNTER — Telehealth: Payer: Self-pay | Admitting: Family Medicine

## 2016-01-10 NOTE — Telephone Encounter (Signed)
Call in Diclofenac 75 mg to take bid prn pain, #60 with 2 rf  

## 2016-01-10 NOTE — Telephone Encounter (Signed)
Pt would like to know if you would prescribe a anti inflammatory for the pain that he is having in his leg the muscle relaxant did not help.  I let pt know that he may need to come in for that he state that he lives in [place] and can not come in.  Pharm:  [facility] [place].

## 2016-01-12 MED ORDER — DICLOFENAC SODIUM 75 MG PO TBEC: 75.0000 mg | DELAYED_RELEASE_TABLET | Freq: Two times a day (BID) | ORAL | 2 refills | Status: DC | PRN

## 2016-01-12 NOTE — Addendum Note (Signed)
Addended by: Aniceto BossNIMMONS, SYLVIA A on: 01/12/2016 11:55 AM   Modules accepted: Orders

## 2016-01-12 NOTE — Telephone Encounter (Signed)
I sent script e-scribe to below pharmacy and left a voice message for pt with this information.

## 2016-01-24 ENCOUNTER — Telehealth: Payer: Self-pay | Admitting: Family Medicine

## 2016-01-24 NOTE — Telephone Encounter (Signed)
° °  Pt call to say he has been taking the following medicine for 2 weeks and said his left leg is still numb. He is asking if he need to have a MRI or see someone else about his back   diclofenac (VOLTAREN) 75 MG EC tablet   [phone]

## 2016-01-24 NOTE — Telephone Encounter (Signed)
Have him make an OV to reassess

## 2016-01-25 NOTE — Telephone Encounter (Signed)
Pt scheduled  

## 2016-01-27 ENCOUNTER — Ambulatory Visit (INDEPENDENT_AMBULATORY_CARE_PROVIDER_SITE_OTHER): Payer: 59 | Admitting: Family Medicine

## 2016-01-27 ENCOUNTER — Encounter: Payer: Self-pay | Admitting: Family Medicine

## 2016-01-27 VITALS — BP 139/90 | HR 92 | Temp 98.7°F | Ht 71.75 in | Wt 330.4 lb

## 2016-01-27 DIAGNOSIS — R030 Elevated blood-pressure reading, without diagnosis of hypertension: Secondary | ICD-10-CM | POA: Diagnosis not present

## 2016-01-27 DIAGNOSIS — M25562 Pain in left knee: Secondary | ICD-10-CM

## 2016-01-27 DIAGNOSIS — M5442 Lumbago with sciatica, left side: Secondary | ICD-10-CM | POA: Diagnosis not present

## 2016-01-27 NOTE — Progress Notes (Signed)
HPI:  Acute visit for:  Leg pain: -PCP had family emergency and had to leave and pt already here - was a follow up with PCP regarding leg pain -per review chart, saw PCP last month for back pain -PMH sig for psoriatic arthritis - sees rheum, morbid obesity -reports: Reports left low back painthat started about 3 weeks ago after bending over to pick something up, he also has had mild tingly pain in the LL lat leg below the knee, reports saw his PCP and was treated with muscle relaxer and then an anti-inflammatory medication was added -Reports symptoms have improved significantly with pain level now at 2 out of 10 in the left low back and a mild tingling sensation in the left lateral lower leg -Denies: Fevers, malaise, weakness, loss of sensitivity, bowel or bladder incontinence -He sees a rheumatologist and an orthopedic specialist as he has a past medical history psoriatic arthritis and osteoarthritis and knee surgery on the L - he is wondering if he should see his orthopedic specialist about his back. Reports he has a remote history of similar back issues that resolved with pain medications and muscle relaxers.  Elevated blood pressure: -Reports this is not unusual for him, reports always elevated this visit -Denies chest pain, shortness of breath, headache or palpitations   ROS: See pertinent positives and negatives per HPI.  Past Medical History:  Diagnosis Date  . Arthritis    psoriatic  . GERD (gastroesophageal reflux disease)   . Gout   . Hypogonadism male    sees Dr. Margarita GrizzleWoodruff   . Morbid obesity (HCC)    peaked at 425 lbs in 2006  . Psoriasis    since age 89    Past Surgical History:  Procedure Laterality Date  . NO PAST SURGERIES      Family History  Problem Relation Age of Onset  . Diabetes    . Hypertension    . Stroke      Social History   Social History  . Marital status: Married    Spouse name: N/A  . Number of children: N/A  . Years of education: N/A    Social History Main Topics  . Smoking status: Never Smoker  . Smokeless tobacco: Never Used  . Alcohol use No  . Drug use: No  . Sexual activity: Not Asked   Other Topics Concern  . None   Social History Narrative   Married           Current Outpatient Prescriptions:  .  Adalimumab (HUMIRA PEN [place]), Inject into the skin. Once every two weeks, Disp: , Rfl:  .  cyclobenzaprine (FLEXERIL) 10 MG tablet, Take 1 tablet (10 mg total) by mouth 3 (three) times daily as needed for muscle spasms., Disp: 60 tablet, Rfl: 2 .  diclofenac (VOLTAREN) 75 MG EC tablet, Take 1 tablet (75 mg total) by mouth 2 (two) times daily as needed., Disp: 60 tablet, Rfl: 2 .  phentermine (ADIPEX-P) 37.5 MG tablet, Take 1 tablet (37.5 mg total) by mouth daily before breakfast., Disp: 90 tablet, Rfl: 1 .  sildenafil (VIAGRA) 100 MG tablet, TAKE 1 TABLET BY MOUTH EVERY DAY AS NEEDED FOR ERECTILE DYSFUNTION, Disp: 10 tablet, Rfl: 11  EXAM:  Vitals:   01/27/16 1104  BP: 139/90  Pulse: 92  Temp: 98.7 F (37.1 C)    Body mass index is 45.12 kg/m.  GENERAL: vitals reviewed and listed above, alert, oriented, appears well hydrated and in no acute distress  HEENT: atraumatic, conjunttiva clear, no obvious abnormalities on inspection of external nose and ears  NECK: no obvious masses on inspection  LUNGS: clear to auscultation bilaterally, no wheezes, rales or rhonchi, good air movement  CV: HRRR, tr bilat ankle edema peripheral edema  MS: moves all extremities without noticeable abnormality Normal Gait Normal inspection of back, no obvious scoliosis or leg length descrepancy No bony TTP Soft tissue TTP at: L PSIS region and L lumbar paraspinal region - mild -/+ tests: neg trendelenburg,-facet loading, -SLRT, -CLRT, -FABER, -FADIR Normal muscle strength, sensation to light touch and DTRs in LEs bilaterally  PSYCH: pleasant and cooperative, no obvious depression or anxiety  ASSESSMENT AND  PLAN:  Discussed the following assessment and plan:  Left-sided low back pain with left-sided sciatica, unspecified chronicity  Elevated blood pressure reading without diagnosis of hypertension  Arthralgia of left lower leg  -discussed potential etiologies, complications, risks, means for further evaluation, expected course -his symptoms seem to be improving and he has no neuro deficits on exam difficult to know if his leg issue is radicular from the back or local compensatory -discussed options and he decided to add HEP for the back, cut back on voltaren once daily given elevated and stop I 1-2 weeks, and take muscle relaxer at night -plan to follow up with ortho if symptoms worsen or persist -plans to follow up with PCP to recheck BP in 1 month -Patient advised to return or notify a doctor immediately if symptoms worsen or persist or new concerns arise.  Patient Instructions  BEFORE YOU LEAVE: -follow up: with Dr. Clent RidgesFry in 1 month -low back exercises  Schedule follow up with your orthopedic doctor in 2 weeks.  Take the voltaren in the morning and the muscle relaxer at night.  Do the exercises 4 days per week.  In the interim seek emergency care if worsening, new symptoms, weakness, severe pain, bowel or bladder incontinence.      Kriste BasqueKIM, Brayli Klingbeil R., DO

## 2016-01-27 NOTE — Patient Instructions (Addendum)
BEFORE YOU LEAVE: -follow up: with Dr. Clent RidgesFry in 1 month -low back exercises  Schedule follow up with your orthopedic doctor in 2 weeks.  Take the voltaren in the morning and the muscle relaxer at night.  Do the exercises 4 days per week.  In the interim seek emergency care if worsening, new symptoms, weakness, severe pain, bowel or bladder incontinence.

## 2016-01-27 NOTE — Progress Notes (Signed)
Pre visit review using our clinic review tool, if applicable. No additional management support is needed unless otherwise documented below in the visit note. 

## 2016-02-01 ENCOUNTER — Telehealth: Payer: Self-pay | Admitting: Family Medicine

## 2016-02-01 DIAGNOSIS — G8929 Other chronic pain: Secondary | ICD-10-CM

## 2016-02-01 DIAGNOSIS — M5442 Lumbago with sciatica, left side: Principal | ICD-10-CM

## 2016-02-01 NOTE — Telephone Encounter (Signed)
° °  Pt saw Dr Selena BattenKim for back pain and his leg is still numbs. He call to ask for a referral to [facility] he would like to see Dr Venetia MaxonStern .

## 2016-02-01 NOTE — Telephone Encounter (Signed)
Before any neurosurgeon sees someone, they will need an MRI scan to be done. I have ordered a lumbar spine MRI soon, and they will contact him about scheduling this

## 2016-02-02 NOTE — Telephone Encounter (Signed)
Left detailed message on personal voicemail, that Dr. Clent RidgesFry ordered MRI lumbar spine and someone will be contacting you to schedule. This needs to be done before referral to Neurosurgeon can be sent. Any questions please call office.

## 2016-02-14 ENCOUNTER — Ambulatory Visit
Admission: RE | Admit: 2016-02-14 | Discharge: 2016-02-14 | Disposition: A | Discharge status: Home / Self Care | Payer: 59 | Source: Ambulatory Visit | Attending: Family Medicine | Admitting: Family Medicine

## 2016-02-14 DIAGNOSIS — G8929 Other chronic pain: Secondary | ICD-10-CM

## 2016-02-14 DIAGNOSIS — M5442 Lumbago with sciatica, left side: Principal | ICD-10-CM

## 2016-02-17 ENCOUNTER — Telehealth: Payer: Self-pay | Admitting: Family Medicine

## 2016-02-17 NOTE — Telephone Encounter (Signed)
Pt would like to have his MRI results and it is okay to leave a detail msg on voicemail.

## 2016-02-17 NOTE — Telephone Encounter (Signed)
Called and left voicemail for pt to return call to office.  

## 2016-02-20 NOTE — Telephone Encounter (Signed)
I left a voice message with results. 

## 2016-03-02 ENCOUNTER — Ambulatory Visit: Payer: Self-pay | Admitting: Family Medicine

## 2016-03-15 ENCOUNTER — Ambulatory Visit (INDEPENDENT_AMBULATORY_CARE_PROVIDER_SITE_OTHER): Payer: 59 | Admitting: Pulmonary Disease

## 2016-03-15 ENCOUNTER — Encounter: Payer: Self-pay | Admitting: Pulmonary Disease

## 2016-03-15 VITALS — BP 162/100 | HR 79 | Ht 74.0 in | Wt 323.0 lb

## 2016-03-15 DIAGNOSIS — G4710 Hypersomnia, unspecified: Secondary | ICD-10-CM | POA: Diagnosis not present

## 2016-03-15 DIAGNOSIS — J441 Chronic obstructive pulmonary disease with (acute) exacerbation: Secondary | ICD-10-CM

## 2016-03-15 NOTE — Assessment & Plan Note (Signed)
Patient has snoring, occasional gasping or choking and witnessed apneas. Sleeps 6-7 hrs per night.  Has unrefreshed sleep sometimes.  He denies abnormal behavior and sleep. Occasional hypersomnia during the daytime.  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 He was sent to occupational health. Occupational health was concerned about sleep apnea so he was sent here.  ESS 10  Plan:   We discussed about the diagnosis of Obstructive Sleep Apnea (OSA) and implications of untreated OSA. We discussed about CPAP and BiPaP as possible treatment options.    We will schedule the patient for a sleep study. (HST). Patient lives in [place] but is usually out of town secondary to work most days. Anticipate no issues with CPAP. [facility]. Patient is currently seeing occupational health was concern about sleep apnea given his work. Needs to forward sleep study results to occupational health. 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    Patient was instructed to call the office if he/she has not heard back from the office 1-2 weeks after the sleep study.   Patient was instructed to call the office if he/she is having issues with the PAP device.   We discussed good sleep hygiene.   Patient was advised not to engage in activities requiring concentration and/or vigilance if he/she is sleepy.  Patient was advised not to drive if he/she is sleepy.

## 2016-03-15 NOTE — Progress Notes (Signed)
Subjective:    Patient ID: Steven Solis, male    DOB: 11/29/1970, 45 y.o.   MRN: [id number]  HPI   This is the case of Steven Solis, 45 y.o. Male, who was referred by Dr. Gershon CraneStephen Fry  in consultation regarding possible OSA.   As you very well know, patient is a non smoker, not known to have asthma or copd.  Patient has snoring, occasional gasping or choking and witnessed apneas. Sleeps 6-7 hrs per night.  Has unrefreshed sleep sometimes.  He denies abnormal behavior and sleep. Occasional hypersomnia during the daytime.  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 He was sent to occupational health. Occupational health was concerned about sleep apnea so he was sent here.  ESS 10    Review of Systems  Constitutional: Positive for unexpected weight change. Negative for fever.  HENT: Negative.  Negative for congestion, dental problem, ear pain, nosebleeds, postnasal drip, rhinorrhea, sinus pressure, sneezing, sore throat and trouble swallowing.   Eyes: Negative.  Negative for redness and itching.  Respiratory: Negative.  Negative for cough, chest tightness, shortness of breath and wheezing.   Cardiovascular: Positive for leg swelling. Negative for palpitations.  Gastrointestinal: Negative.  Negative for nausea and vomiting.  Endocrine: Negative.   Genitourinary: Negative.  Negative for dysuria.  Musculoskeletal: Negative.  Negative for joint swelling.  Skin: Negative.  Negative for rash.  Allergic/Immunologic: Negative.   Neurological: Negative.  Negative for headaches.  Hematological: Negative.  Does not bruise/bleed easily.  Psychiatric/Behavioral: Negative.  Negative for dysphoric mood. The patient is not nervous/anxious.    Past Medical History:  Diagnosis Date  . Arthritis    psoriatic  . GERD (gastroesophageal reflux disease)   . Gout   . Hypogonadism male    sees Dr. Margarita GrizzleWoodruff   . Morbid obesity (HCC)    peaked at  425 lbs in 2006  . Psoriasis    since age 89     Family History  Problem Relation Age of Onset  . Diabetes    . Hypertension    . Stroke    . COPD Mother   . Cancer Father      Past Surgical History:  Procedure Laterality Date  . left knee surgery Left   . NO PAST SURGERIES      Social History   Social History  . Marital status: Married    Spouse name: N/A  . Number of children: N/A  . Years of education: N/A   Occupational History  . Not on file.   Social History Main Topics  . Smoking status: Never Smoker  . Smokeless tobacco: Never Used  . Alcohol use No  . Drug use: No  . Sexual activity: Not on file   Other Topics Concern  . Not on file   Social History Narrative   Married         [place] in [place]. He works for [employer] which requires him to do a lot of traveling locally and out of state.   Allergies  Allergen Reactions  . Codeine     Caused vomitting     Outpatient Medications Prior to Visit  Medication Sig Dispense Refill  . Adalimumab (HUMIRA PEN [place]) Inject into the skin. Once every two weeks    . cyclobenzaprine (FLEXERIL) 10 MG tablet Take 1 tablet (10 mg total) by mouth 3 (three) times daily as needed for muscle spasms. 60  tablet 2  . diclofenac (VOLTAREN) 75 MG EC tablet Take 1 tablet (75 mg total) by mouth 2 (two) times daily as needed. 60 tablet 2  . phentermine (ADIPEX-P) 37.5 MG tablet Take 1 tablet (37.5 mg total) by mouth daily before breakfast. 90 tablet 1  . sildenafil (VIAGRA) 100 MG tablet TAKE 1 TABLET BY MOUTH EVERY DAY AS NEEDED FOR ERECTILE DYSFUNTION 10 tablet 11   No facility-administered medications prior to visit.    No orders of the defined types were placed in this encounter.        Objective:   Physical Exam  Vitals:  Vitals:   03/15/16 1220  BP: (!) 162/100  Pulse: 79  SpO2: 99%  Weight: (!) 323 lb (146.5 kg)  Height: 6\' 2"  (1.88 m)    Constitutional/General:  Pleasant, well-nourished,  well-developed, not in any distress,  Comfortably seating.  Well kempt  Body mass index is 41.47 kg/m. Wt Readings from Last 3 Encounters:  03/15/16 (!) 323 lb (146.5 kg)  01/27/16 (!) 330 lb 6.4 oz (149.9 kg)  12/27/15 (!) 330 lb (149.7 kg)    Neck circumference: 19 inches  HEENT: Pupils equal and reactive to light and accommodation. Anicteric sclerae. Normal nasal mucosa.   No oral  lesions,  mouth clear,  oropharynx clear, no postnasal drip. (-) Oral thrush. No dental caries.  Airway - Mallampati class III-IV  Neck: No masses. Midline trachea. No JVD, (-) LAD. (-) bruits appreciated.  Respiratory/Chest: Grossly normal chest. (-) deformity. (-) Accessory muscle use.  Symmetric expansion. (-) Tenderness on palpation.  Resonant on percussion.  Diminished BS on both lower lung zones. (-) wheezing, crackles, rhonchi (-) egophony  Cardiovascular: Regular rate and  rhythm, heart sounds normal, no murmur or gallops, no peripheral edema  Gastrointestinal:  Normal bowel sounds. Soft, non-tender. No hepatosplenomegaly.  (-) masses.   Musculoskeletal:  Normal muscle tone. Normal gait.   Extremities: Grossly normal. (-) clubbing, cyanosis.  (-) edema  Skin: (-) rash,lesions seen.   Neurological/Psychiatric : alert, oriented to time, place, person. Normal mood and affect          Assessment & Plan:  Hypersomnia Patient has snoring, occasional gasping or choking and witnessed apneas. Sleeps 6-7 hrs per night.  Has unrefreshed sleep sometimes.  He denies abnormal behavior and sleep. Occasional hypersomnia during the daytime.  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 He was sent to occupational health. Occupational health was concerned about sleep apnea so he was sent here.  ESS 10  Plan:   We discussed about the diagnosis of Obstructive Sleep Apnea (OSA) and implications of untreated OSA. We discussed about CPAP  and BiPaP as possible treatment options.    We will schedule the patient for a sleep study. (HST). Patient lives in [place] but is usually out of town secondary to work most days. Anticipate no issues with CPAP. [facility]. Patient is currently seeing occupational health was concern about sleep apnea given his work. Needs to forward sleep study results to occupational health. 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    Patient was instructed to call the office if he/she has not heard back from the office 1-2 weeks after the sleep study.   Patient was instructed to call the office if he/she is having issues with the PAP device.   We discussed good sleep hygiene.   Patient was  advised not to engage in activities requiring concentration and/or vigilance if he/she is sleepy.  Patient was advised not to drive if he/she is sleepy.       Thank you very much for letting me participate in this patient's care. Please do not hesitate to give me a call if you have any questions or concerns regarding the treatment plan.   Patient will follow up with me in 6-8 weeks   J. Alexis Frock, MD 03/15/2016   1:34 PM Pulmonary and Critical Care Medicine [facility] Pager: [phone] Office: [phone], Fax: [phone]

## 2016-03-15 NOTE — Patient Instructions (Signed)

## 2016-03-24 DIAGNOSIS — G4733 Obstructive sleep apnea (adult) (pediatric): Secondary | ICD-10-CM

## 2016-03-26 ENCOUNTER — Telehealth: Payer: Self-pay | Admitting: Pulmonary Disease

## 2016-03-26 DIAGNOSIS — G4733 Obstructive sleep apnea (adult) (pediatric): Secondary | ICD-10-CM

## 2016-03-26 NOTE — Telephone Encounter (Signed)
  Please call the pt and tell the pt the HOME SLEEP STUDY  showed OSA   Pt stops breathing  59  times an hour.   Home sleep study was done on : 03/24/16  Please order autoCPAP 5-15 cm H2O. Patient will need a mask fitting session. Patient will need a 1 month download.   Patient needs to be seen by me or any of the NPs/APPs  4-6 weeks after obtaining the cpap machine.  Please tell the patient, if the download is not good, he will need a BiPAP titration study.  Pls send  a copy of the sleep study report to the patient's occupational health at work.  Let me know if you receive this.   Thanks!   J. Alexis FrockAngelo A de Dios, MD 03/26/2016, 1:03 PM

## 2016-03-27 ENCOUNTER — Other Ambulatory Visit: Payer: Self-pay | Admitting: *Deleted

## 2016-03-27 DIAGNOSIS — G4710 Hypersomnia, unspecified: Secondary | ICD-10-CM

## 2016-03-27 NOTE — Telephone Encounter (Signed)
Spoke with pt. His sleep study per AD, pt. Agreed to the order of being placed on a CPAP machine. The order was placed. Pt also went ahead and scheduled a follow up appointment. Nothing further is needed at this time.   [address]Jose Angelo Dorcas McmurrayA de Dios, MD      03/26/16 1:03 PM  Note     Please call the pt and tell the pt the HOME SLEEP STUDY  showed OSA   Pt stops breathing  59  times an hour.   Home sleep study was done on : 03/24/16  Please order autoCPAP 5-15 cm H2O. Patient will need a mask fitting session. Patient will need a 1 month download.   Patient needs to be seen by me or any of the NPs/APPs  4-6 weeks after obtaining the cpap machine.  Please tell the patient, if the download is not good, he will need a BiPAP titration study.  Pls send  a copy of the sleep study report to the patient's occupational health at work.  Let me know if you receive this.   Thanks!   J. Alexis FrockAngelo A de Dios, MD 03/26/2016, 1:03 PM

## 2016-05-15 ENCOUNTER — Encounter: Payer: Self-pay | Admitting: Pulmonary Disease

## 2016-05-15 ENCOUNTER — Ambulatory Visit (INDEPENDENT_AMBULATORY_CARE_PROVIDER_SITE_OTHER): Payer: 59 | Admitting: Pulmonary Disease

## 2016-05-15 DIAGNOSIS — G4733 Obstructive sleep apnea (adult) (pediatric): Secondary | ICD-10-CM | POA: Diagnosis not present

## 2016-05-15 NOTE — Progress Notes (Signed)
Subjective:    Patient ID: Steven Solis, male    DOB: 12/10/1970, 46 y.o.   MRN: [id number]  HPI   This is the case of Steven Solis, 46 y.o. Male, who was referred by Dr. Gershon Crane  in consultation regarding possible OSA.   As you very well know, patient is a non smoker, not known to have asthma or copd.  Patient has snoring, occasional gasping or choking and witnessed apneas. Sleeps 6-7 hrs per night.  Has unrefreshed sleep sometimes.  He denies abnormal behavior and sleep. Occasional hypersomnia during the daytime.  He works at [employer] for which she does a lot of driving locally and out of state. He was sent to occupational health. Occupational health was concerned about sleep apnea so he was sent here.  ESS 10  ROV 05/15/2016 Patient returns to the office as follow-up on his sleep apnea. Since last seen, he had a home sleep study done in January 2018 which showed an AHI of 59. He was started on auto CPAP 5-15 centimeters water. He feels better using it. More energy. Less sleepiness. Download the last month: 50%, AHI 1.3. He had some mask leak issues but is better now. He has a nasal mask. Less hypersomnia.  Review of Systems  Constitutional: Positive for unexpected weight change. Negative for fever.  HENT: Negative.  Negative for congestion, dental problem, ear pain, nosebleeds, postnasal drip, rhinorrhea, sinus pressure, sneezing, sore throat and trouble swallowing.   Eyes: Negative.  Negative for redness and itching.  Respiratory: Negative.  Negative for cough, chest tightness, shortness of breath and wheezing.   Cardiovascular: Positive for leg swelling. Negative for palpitations.  Gastrointestinal: Negative.  Negative for nausea and vomiting.  Endocrine: Negative.   Genitourinary: Negative.  Negative for dysuria.  Musculoskeletal: Negative.  Negative for joint swelling.  Skin: Negative.  Negative for rash.  Allergic/Immunologic: Negative.   Neurological:  Negative.  Negative for headaches.  Hematological: Negative.  Does not bruise/bleed easily.  Psychiatric/Behavioral: Negative.  Negative for dysphoric mood. The patient is not nervous/anxious.         Objective:   Physical Exam  Vitals:  Vitals:   05/15/16 0943  BP: (!) 168/100  Pulse: 73  SpO2: 97%  Weight: (!) 341 lb 3.2 oz (154.8 kg)  Height: 6\' 2"  (1.88 m)    Constitutional/General:  Pleasant, well-nourished, well-developed, not in any distress,  Comfortably seating.  Well kempt  Body mass index is 43.81 kg/m. Wt Readings from Last 3 Encounters:  05/15/16 (!) 341 lb 3.2 oz (154.8 kg)  03/15/16 (!) 323 lb (146.5 kg)  01/27/16 (!) 330 lb 6.4 oz (149.9 kg)    Neck circumference: 19 inches  HEENT: Pupils equal and reactive to light and accommodation. Anicteric sclerae. Normal nasal mucosa.   No oral  lesions,  mouth clear,  oropharynx clear, no postnasal drip. (-) Oral thrush. No dental caries.  Airway - Mallampati class III-IV  Neck: No masses. Midline trachea. No JVD, (-) LAD. (-) bruits appreciated.  Respiratory/Chest: Grossly normal chest. (-) deformity. (-) Accessory muscle use.  Symmetric expansion. (-) Tenderness on palpation.  Resonant on percussion.  Diminished BS on both lower lung zones. (-) wheezing, crackles, rhonchi (-) egophony  Cardiovascular: Regular rate and  rhythm, heart sounds normal, no murmur or gallops, no peripheral edema  Gastrointestinal:  Normal bowel sounds. Soft, non-tender. No hepatosplenomegaly.  (-) masses.   Musculoskeletal:  Normal muscle tone. Normal gait.   Extremities: Grossly normal. (-)  clubbing, cyanosis.  (-) edema  Skin: (-) rash,lesions seen.   Neurological/Psychiatric : alert, oriented to time, place, person. Normal mood and affect          Assessment & Plan:  OSA (obstructive sleep apnea) Patient has snoring, occasional gasping or choking and witnessed apneas. Sleeps 6-7 hrs per night.  Has  unrefreshed sleep sometimes.  He denies abnormal behavior and sleep. Occasional hypersomnia during the daytime.  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 He was sent to occupational health. Occupational health was concerned about sleep apnea so he was sent here.  ESS 10  Patient had a home sleep study done in January 2018 with an AHI of 59. Started on auto CPAP 5-15 centimeters water. Feels better using it. More energy. Less sleepiness. Download the last month: 50%: AHI 1.3. He had mask leak issues but is better now. Currently has a nasal mask. He is better using it. More energy. Less sleepiness.  Plan:   We extensively discussed the importance of treating OSA and the need to use PAP therapy.   Continue with autocpap 5-15 cm water.  Has a nasal mask.  May need a FFM. 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  He feels better using CPAP. More energy. Less sleepiness. Plan to get a download from February 19 for 1 month. If with good compliance, we need to fax his information to occupational health.    Patient was instructed to have mask, tubings, filter, reservoir cleaned at least once a week with soapy water.  Patient was instructed to call the office if he/she is having issues with the PAP device.    I advised patient to obtain sufficient amount of sleep --  7 to 8 hours at least in a 24 hr period.  Patient was advised to follow good sleep hygiene.  Patient was advised NOT to engage in activities requiring concentration and/or vigilance if he/she is and  sleepy.  Patient is NOT to drive if he/she is sleepy.        Patient will follow up with me in 6mos.    Pollie MeyerJ. Angelo A. de Dios, MD 05/15/2016   10:28 AM Pulmonary and Critical Care Medicine [facility] Pager: [phone] Office: [phone], Fax: [phone]

## 2016-05-15 NOTE — Assessment & Plan Note (Addendum)
Patient has snoring, occasional gasping or choking and witnessed apneas. Sleeps 6-7 hrs per night.  Has unrefreshed sleep sometimes.  He denies abnormal behavior and sleep. Occasional hypersomnia during the daytime.  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 He was sent to occupational health. Occupational health was concerned about sleep apnea so he was sent here.  ESS 10  Patient had a home sleep study done in January 2018 with an AHI of 59. Started on auto CPAP 5-15 centimeters water. Feels better using it. More energy. Less sleepiness. Download the last month: 50%: AHI 1.3. He had mask leak issues but is better now. Currently has a nasal mask. He is better using it. More energy. Less sleepiness.  Plan:   We extensively discussed the importance of treating OSA and the need to use PAP therapy.   Continue with autocpap 5-15 cm water.  Has a nasal mask.  May need a FFM. 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  He feels better using CPAP. More energy. Less sleepiness. Plan to get a download from February 19 for 1 month. If with good compliance, we need to fax his information to occupational health.    Patient was instructed to have mask, tubings, filter, reservoir cleaned at least once a week with soapy water.  Patient was instructed to call the office if he/she is having issues with the PAP device.    I advised patient to obtain sufficient amount of sleep --  7 to 8 hours at least in a 24 hr period.  Patient was advised to follow good sleep hygiene.  Patient was advised NOT to engage in activities requiring concentration and/or vigilance if he/she is and  sleepy.  Patient is NOT to drive if he/she is sleepy.

## 2016-05-15 NOTE — Patient Instructions (Signed)
  It was a pleasure taking care of you today!  Continue using your CPAP machine.   Please make sure you use your CPAP device everytime you sleep.  We will monitor the usage of your machine per your insurance requirement.  Your insurance company may take the machine from you if you are not using it regularly.   Please clean the mask, tubings, filter, water reservoir with soapy water every week.  Please use distilled water for the water reservoir.   Please call the office or your machine provider (DME company) if you are having issues with the device.   Return to clinic in 6 months  with  NP/APP   

## 2016-06-11 ENCOUNTER — Telehealth: Payer: Self-pay

## 2016-06-11 NOTE — Telephone Encounter (Signed)
Received PA request from insurance company for Viagra. PA is approved, no need for fax to pharmacy because it is for next fill.

## 2016-06-14 ENCOUNTER — Telehealth: Payer: Self-pay | Admitting: Pulmonary Disease

## 2016-06-14 NOTE — Telephone Encounter (Signed)
Called spoke with patient who is requesting a copy of his CPAP download be sent him for his work.  Pt was unsure if he would receive this in time if it is to be mailed so he will pick it up on Monday 4.2.18.  Copy of download placed in brown folder up front Nothing further needed; will sign off.

## 2016-06-19 ENCOUNTER — Telehealth: Payer: Self-pay | Admitting: Pulmonary Disease

## 2016-06-19 NOTE — Telephone Encounter (Signed)
[id number] x1 

## 2016-06-19 NOTE — Telephone Encounter (Signed)
   DL last month on autocpap 5-15 cm water: 60%, AHI 2.   Pls tell pt the cpap works. He needs to use it longer, at least 4 hrs/night.   Pollie Meyer, MD 06/19/2016, 1:00 AM [facility] Pager [phone] After 3 pm or if no answer, call [phone]

## 2016-06-20 NOTE — Telephone Encounter (Signed)
Spoke with the pt and notified of results of DL  I printed him off a 2 month DL from [facility] per his request and placed up front  Nothing further needed

## 2016-06-20 NOTE — Telephone Encounter (Signed)
Patient returned phone call, can leave message because working on machines at work.. patient contact # [phone].Charm Rings

## 2016-06-21 ENCOUNTER — Ambulatory Visit (INDEPENDENT_AMBULATORY_CARE_PROVIDER_SITE_OTHER): Payer: 59 | Admitting: Family Medicine

## 2016-06-21 ENCOUNTER — Encounter: Payer: Self-pay | Admitting: Family Medicine

## 2016-06-21 VITALS — BP 140/88 | HR 74 | Temp 98.4°F | Wt 342.4 lb

## 2016-06-21 DIAGNOSIS — R030 Elevated blood-pressure reading, without diagnosis of hypertension: Secondary | ICD-10-CM | POA: Diagnosis not present

## 2016-06-21 NOTE — Progress Notes (Signed)
Pre visit review using our clinic review tool, if applicable. No additional management support is needed unless otherwise documented below in the visit note. 

## 2016-06-21 NOTE — Patient Instructions (Addendum)
It was a pleasure to see you today.  Please monitor blood pressure at home for 1 to 2 weeks, document readings, and bring with you to your follow up appointment with Dr. Clent Ridges. Omron is a good brand for a blood pressure cuff. Minimal Blood Pressure Goal= AVERAGE < 140/90; Ideal is an AVERAGE < 135/85. This AVERAGE should be calculated from @ least 5-7 BP readings taken @ different times of day on different days of week. You should not respond to isolated BP readings , but rather the AVERAGE for that week .Please bring your blood pressure cuff to office visits to verify that it is reliable.It can also be checked against the blood pressure device at the pharmacy. Finger or wrist cuffs are not dependable; an arm cuff is.  Also, please drink plenty of water and decrease salt intake in your diet. Avoidance of over the counter cold medications that can raise blood pressure is recommended.   DASH Eating Plan DASH stands for "Dietary Approaches to Stop Hypertension." The DASH eating plan is a healthy eating plan that has been shown to reduce high blood pressure (hypertension). It may also reduce your risk for type 2 diabetes, heart disease, and stroke. The DASH eating plan may also help with weight loss. What are tips for following this plan? General guidelines   Avoid eating more than 2,300 mg (milligrams) of salt (sodium) a day. If you have hypertension, you may need to reduce your sodium intake to 1,500 mg a day.  Limit alcohol intake to no more than 1 drink a day for nonpregnant women and 2 drinks a day for men. One drink equals 12 oz of beer, 5 oz of wine, or 1 oz of hard liquor.  Work with your health care provider to maintain a healthy body weight or to lose weight. Ask what an ideal weight is for you.  Get at least 30 minutes of exercise that causes your heart to beat faster (aerobic exercise) most days of the week. Activities may include walking, swimming, or biking.  Work with your health care  provider or diet and nutrition specialist (dietitian) to adjust your eating plan to your individual calorie needs. Reading food labels   Check food labels for the amount of sodium per serving. Choose foods with less than 5 percent of the Daily Value of sodium. Generally, foods with less than 300 mg of sodium per serving fit into this eating plan.  To find whole grains, look for the word "whole" as the first word in the ingredient list. Shopping   Buy products labeled as "low-sodium" or "no salt added."  Buy fresh foods. Avoid canned foods and premade or frozen meals. Cooking   Avoid adding salt when cooking. Use salt-free seasonings or herbs instead of table salt or sea salt. Check with your health care provider or pharmacist before using salt substitutes.  Do not fry foods. Cook foods using healthy methods such as baking, boiling, grilling, and broiling instead.  Cook with heart-healthy oils, such as olive, canola, soybean, or sunflower oil. Meal planning    Eat a balanced diet that includes:  5 or more servings of fruits and vegetables each day. At each meal, try to fill half of your plate with fruits and vegetables.  Up to 6-8 servings of whole grains each day.  Less than 6 oz of lean meat, poultry, or fish each day. A 3-oz serving of meat is about the same size as a deck of cards. One egg equals  1 oz.  2 servings of low-fat dairy each day.  A serving of nuts, seeds, or beans 5 times each week.  Heart-healthy fats. Healthy fats called Omega-3 fatty acids are found in foods such as flaxseeds and coldwater fish, like sardines, salmon, and mackerel.  Limit how much you eat of the following:  Canned or prepackaged foods.  Food that is high in trans fat, such as fried foods.  Food that is high in saturated fat, such as fatty meat.  Sweets, desserts, sugary drinks, and other foods with added sugar.  Full-fat dairy products.  Do not salt foods before eating.  Try to eat  at least 2 vegetarian meals each week.  Eat more home-cooked food and less restaurant, buffet, and fast food.  When eating at a restaurant, ask that your food be prepared with less salt or no salt, if possible. What foods are recommended? The items listed may not be a complete list. Talk with your dietitian about what dietary choices are best for you. Grains  Whole-grain or whole-wheat bread. Whole-grain or whole-wheat pasta. Brown rice. Orpah Cobb. Bulgur. Whole-grain and low-sodium cereals. Pita bread. Low-fat, low-sodium crackers. Whole-wheat flour tortillas. Vegetables  Fresh or frozen vegetables (raw, steamed, roasted, or grilled). Low-sodium or reduced-sodium tomato and vegetable juice. Low-sodium or reduced-sodium tomato sauce and tomato paste. Low-sodium or reduced-sodium canned vegetables. Fruits  All fresh, dried, or frozen fruit. Canned fruit in natural juice (without added sugar). Meat and other protein foods  Skinless chicken or [place]. Ground chicken or [place]. Pork with fat trimmed off. Fish and seafood. Egg whites. Dried beans, peas, or lentils. Unsalted nuts, nut butters, and seeds. Unsalted canned beans. Lean cuts of beef with fat trimmed off. Low-sodium, lean deli meat. Dairy  Low-fat (1%) or fat-free (skim) milk. Fat-free, low-fat, or reduced-fat cheeses. Nonfat, low-sodium ricotta or cottage cheese. Low-fat or nonfat yogurt. Low-fat, low-sodium cheese. Fats and oils  Soft margarine without trans fats. Vegetable oil. Low-fat, reduced-fat, or light mayonnaise and salad dressings (reduced-sodium). Canola, safflower, olive, soybean, and sunflower oils. Avocado. Seasoning and other foods  Herbs. Spices. Seasoning mixes without salt. Unsalted popcorn and pretzels. Fat-free sweets. What foods are not recommended? The items listed may not be a complete list. Talk with your dietitian about what dietary choices are best for you. Grains  Baked goods made with fat, such as  croissants, muffins, or some breads. Dry pasta or rice meal packs. Vegetables  Creamed or fried vegetables. Vegetables in a cheese sauce. Regular canned vegetables (not low-sodium or reduced-sodium). Regular canned tomato sauce and paste (not low-sodium or reduced-sodium). Regular tomato and vegetable juice (not low-sodium or reduced-sodium). Rosita Fire. Olives. Fruits  Canned fruit in a light or heavy syrup. Fried fruit. Fruit in cream or butter sauce. Meat and other protein foods  Fatty cuts of meat. Ribs. Fried meat. Tomasa Blase. Sausage. Bologna and other processed lunch meats. Salami. Fatback. Hotdogs. Bratwurst. Salted nuts and seeds. Canned beans with added salt. Canned or smoked fish. Whole eggs or egg yolks. Chicken or [place] with skin. Dairy  Whole or 2% milk, cream, and half-and-half. Whole or full-fat cream cheese. Whole-fat or sweetened yogurt. Full-fat cheese. Nondairy creamers. Whipped toppings. Processed cheese and cheese spreads. Fats and oils  Butter. Stick margarine. Lard. Shortening. Ghee. Bacon fat. Tropical oils, such as coconut, palm kernel, or palm oil. Seasoning and other foods  Salted popcorn and pretzels. Onion salt, garlic salt, seasoned salt, table salt, and sea salt. Worcestershire sauce. Tartar sauce. Barbecue sauce. Teriyaki  sauce. Soy sauce, including reduced-sodium. Steak sauce. Canned and packaged gravies. Fish sauce. Oyster sauce. Cocktail sauce. Horseradish that you find on the shelf. Ketchup. Mustard. Meat flavorings and tenderizers. Bouillon cubes. Hot sauce and Tabasco sauce. Premade or packaged marinades. Premade or packaged taco seasonings. Relishes. Regular salad dressings. Where to find more information:  National Heart, Lung, and Blood Institute: PopSteam.is  American Heart Association: www.heart.org Summary  The DASH eating plan is a healthy eating plan that has been shown to reduce high blood pressure (hypertension). It may also reduce your risk for  type 2 diabetes, heart disease, and stroke.  With the DASH eating plan, you should limit salt (sodium) intake to 2,300 mg a day. If you have hypertension, you may need to reduce your sodium intake to 1,500 mg a day.  When on the DASH eating plan, aim to eat more fresh fruits and vegetables, whole grains, lean proteins, low-fat dairy, and heart-healthy fats.  Work with your health care provider or diet and nutrition specialist (dietitian) to adjust your eating plan to your individual calorie needs. This information is not intended to replace advice given to you by your health care provider. Make sure you discuss any questions you have with your health care provider. Document Released: 02/22/2011 Document Revised: 02/27/2016 Document Reviewed: 02/27/2016 Elsevier Interactive Patient Education  2017 Elsevier Inc.   WE NOW OFFER   [facility] FAST TRACK!!!  SAME DAY Appointments for ACUTE CARE  Such as: Sprains, Injuries, cuts, abrasions, rashes, muscle pain, joint pain, back pain Colds, flu, sore throats, headache, allergies, cough, fever  Ear pain, sinus and eye infections Abdominal pain, nausea, vomiting, diarrhea, upset stomach Animal/insect bites  3 Easy Ways to Schedule: Walk-In Scheduling Call in scheduling Mychart Sign-up: [email or web address]

## 2016-06-21 NOTE — Progress Notes (Signed)
Subjective:    Patient ID: Steven Solis, male    DOB: 12/10/1970, 46 y.o.   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  Due to BP, he did not pass this physical. He reports feeling well and he states that he was worried he would fail his physical. BP reports from DOT indicates readings as high as 187/130 without symptoms He does not have a history of BP medication. History of phentermine use and patient reports that last use was in January this year as he did not feel it was benefiting him at this time and he "needed a break from it" Today, BP is noted as 138/80. He denies chest pain, palpitations, SOB, numbness, tingling, or weakness. He does not monitor his BP at home and reports that he does not add salt to his diet. He reports using an OTC cold medicine yesterday, drinking 2 cups of coffee before DOT exam and driving in from [place]. He reports that he was stressed and anxious about exam.   Review of Systems  Constitutional: Negative for chills, fatigue and fever.  Eyes: Negative for visual disturbance.  Respiratory: Negative for cough, shortness of breath and wheezing.   Cardiovascular: Negative for chest pain, palpitations and leg swelling.  Gastrointestinal: Negative for abdominal pain, diarrhea, nausea and vomiting.  Musculoskeletal: Negative for myalgias.  Neurological: Negative for dizziness, weakness, light-headedness and numbness.   Past Medical History:  Diagnosis Date  . Arthritis    psoriatic  . GERD (gastroesophageal reflux disease)   . Gout   . Hypogonadism male    sees Dr. Margarita Grizzle   . Morbid obesity (HCC)    peaked at 425 lbs in 2006  . Psoriasis    since age 61     Social History   Social History  . Marital status: Married    Spouse name: N/A  . Number of children: N/A  . Years of education: N/A   Occupational History  . Not on file.   Social History  Main Topics  . Smoking status: Never Smoker  . Smokeless tobacco: Never Used  . Alcohol use No  . Drug use: No  . Sexual activity: Not on file   Other Topics Concern  . Not on file   Social History Narrative   Married          Past Surgical History:  Procedure Laterality Date  . left knee surgery Left   . NO PAST SURGERIES      Family History  Problem Relation Age of Onset  . Diabetes    . Hypertension    . Stroke    . COPD Mother   . Cancer Father     Allergies  Allergen Reactions  . Codeine     Caused vomitting    Current Outpatient Prescriptions on File Prior to Visit  Medication Sig Dispense Refill  . Adalimumab (HUMIRA PEN [place]) Inject into the skin. Once every two weeks    . cyclobenzaprine (FLEXERIL) 10 MG tablet Take 1 tablet (10 mg total) by mouth 3 (three) times daily as needed for muscle spasms. 60 tablet 2  . diclofenac (VOLTAREN) 75 MG EC tablet Take 1 tablet (75 mg total) by mouth 2 (two) times daily as needed. 60 tablet 2  . sildenafil (VIAGRA) 100 MG tablet TAKE 1 TABLET BY MOUTH EVERY DAY AS NEEDED FOR ERECTILE  DYSFUNTION 10 tablet 11  . phentermine (ADIPEX-P) 37.5 MG tablet Take 1 tablet (37.5 mg total) by mouth daily before breakfast. (Patient not taking: Reported on 06/21/2016) 90 tablet 1   No current facility-administered medications on file prior to visit.     BP 140/88   Pulse 74   Temp 98.4 F (36.9 C) (Oral)   Wt (!) 342 lb 6.4 oz (155.3 kg)   SpO2 97%   BMI 43.96 kg/m        Objective:   Physical Exam  Constitutional: He is oriented to person, place, and time. He appears well-developed and well-nourished.  Eyes: Pupils are equal, round, and reactive to light. No scleral icterus.  Neck: Neck supple.  Cardiovascular: Normal rate, regular rhythm and intact distal pulses.   Pulmonary/Chest: Effort normal and breath sounds normal. He has no wheezes. He has no rales.  Abdominal: Soft. Bowel sounds are normal.  Musculoskeletal: He  exhibits no edema.  Lymphadenopathy:    He has no cervical adenopathy.  Neurological: He is alert and oriented to person, place, and time. Coordination normal.  Skin: Skin is warm and dry. No rash noted.       Assessment & Plan:  1. Elevated BP without diagnosis of hypertension BP readings elevated at DOT physical today; no elevated BP noted in the office at this time; recent discontinuation of phentermine is reported; prior use of OTC cold medication and drinking coffee prior to DOT physical or size of BP cuff, may have influenced BP.   As no history of HTN is noted; no symptoms; advised monitoring of BP for 1 to 2 weeks with follow up with Dr. Clent Ridges for further evaluation. BP parameters provided to patient. He agreed to monitoring and will bring readings with him to his next appointment.  He will seek care sooner if readings are >140/90. Copy of DOT letter request that patient will bring with him to his next appointment sent to Dr. Clent Ridges for evaluation at patient's next appointment.  Roddie Mc, FNP-C

## 2016-07-02 ENCOUNTER — Ambulatory Visit (INDEPENDENT_AMBULATORY_CARE_PROVIDER_SITE_OTHER): Payer: 59 | Admitting: Family Medicine

## 2016-07-02 ENCOUNTER — Encounter: Payer: Self-pay | Admitting: Family Medicine

## 2016-07-02 VITALS — BP 122/74 | Temp 98.3°F | Ht 74.0 in | Wt 336.0 lb

## 2016-07-02 DIAGNOSIS — R739 Hyperglycemia, unspecified: Secondary | ICD-10-CM

## 2016-07-02 DIAGNOSIS — I10 Essential (primary) hypertension: Secondary | ICD-10-CM | POA: Diagnosis not present

## 2016-07-02 DIAGNOSIS — G4733 Obstructive sleep apnea (adult) (pediatric): Secondary | ICD-10-CM | POA: Diagnosis not present

## 2016-07-02 MED ORDER — LISINOPRIL 20 MG PO TABS: 20.0000 mg | ORAL_TABLET | Freq: Every day | ORAL | 3 refills | Status: DC

## 2016-07-02 NOTE — Patient Instructions (Signed)
WE NOW OFFER   [facility] FAST TRACK!!!  SAME DAY Appointments for ACUTE CARE  Such as: Sprains, Injuries, cuts, abrasions, rashes, muscle pain, joint pain, back pain Colds, flu, sore throats, headache, allergies, cough, fever  Ear pain, sinus and eye infections Abdominal pain, nausea, vomiting, diarrhea, upset stomach Animal/insect bites  3 Easy Ways to Schedule: Walk-In Scheduling Call in scheduling Mychart Sign-up: [email or web address]         

## 2016-07-02 NOTE — Progress Notes (Signed)
Pre visit review using our clinic review tool, if applicable. No additional management support is needed unless otherwise documented below in the visit note. 

## 2016-07-02 NOTE — Progress Notes (Signed)
   Subjective:    Patient ID: Steven Solis, male    DOB: 03-Apr-1970, 46 y.o.   MRN: [id number]  HPI Here to follow up elevated BP. He feels fine but he had a DOT exam on 06-20-16 and his BP was extremely high on several checks, ranging from 160-187/ 110-131. He was not allowed to drive until he sees his PCP and the BP is controlled. He had also been using some Phentermine and he was told to stop that (he actually stopped taking this about a month ago). He also admitted to not wearing his CPAP at night recently. He admits to putting on a lot of weight over th past few months.    Review of Systems  Constitutional: Negative.   Respiratory: Negative.   Cardiovascular: Negative.   Endocrine: Negative.   Neurological: Negative.        Objective:   Physical Exam  Constitutional: He is oriented to person, place, and time. No distress.  obese  Neck: No thyromegaly present.  Cardiovascular: Normal rate, regular rhythm, normal heart sounds and intact distal pulses.   Pulmonary/Chest: Effort normal and breath sounds normal.  Musculoskeletal: He exhibits no edema.  Lymphadenopathy:    He has no cervical adenopathy.  Neurological: He is alert and oriented to person, place, and time.          Assessment & Plan:  He now has HTN and he is very overweight. We discussed diet and exercise. Start on Lisinopril 20 mg daily. He will avoid taking any Phentermine. He agreed to use his CPAP every night. He will follow up with [name] in 2 weeks.  Gershon Crane, MD

## 2016-07-03 DIAGNOSIS — I10 Essential (primary) hypertension: Secondary | ICD-10-CM | POA: Insufficient documentation

## 2016-07-11 ENCOUNTER — Other Ambulatory Visit (INDEPENDENT_AMBULATORY_CARE_PROVIDER_SITE_OTHER): Payer: 59

## 2016-07-11 DIAGNOSIS — R7989 Other specified abnormal findings of blood chemistry: Secondary | ICD-10-CM | POA: Diagnosis not present

## 2016-07-11 DIAGNOSIS — R739 Hyperglycemia, unspecified: Secondary | ICD-10-CM

## 2016-07-11 LAB — LIPID PANEL
Cholesterol: 154 mg/dL (ref 0–200)
HDL: 34.6 mg/dL — ABNORMAL LOW (ref >39.00)
NonHDL: 119.51
Total CHOL/HDL Ratio: 4
Triglycerides: 265 mg/dL — ABNORMAL HIGH (ref 0.0–149.0)
VLDL: 53 mg/dL — ABNORMAL HIGH (ref 0.0–40.0)

## 2016-07-11 LAB — HEMOGLOBIN A1C: Hgb A1c MFr Bld: 5.4 % (ref 4.6–6.5)

## 2016-07-11 LAB — LDL CHOLESTEROL, DIRECT: Direct LDL: 77 mg/dL

## 2016-07-16 ENCOUNTER — Encounter: Payer: Self-pay | Admitting: Family Medicine

## 2016-07-16 ENCOUNTER — Ambulatory Visit (INDEPENDENT_AMBULATORY_CARE_PROVIDER_SITE_OTHER): Payer: 59 | Admitting: Family Medicine

## 2016-07-16 VITALS — BP 144/96 | Temp 98.3°F | Ht 74.0 in | Wt 344.0 lb

## 2016-07-16 DIAGNOSIS — I10 Essential (primary) hypertension: Secondary | ICD-10-CM | POA: Diagnosis not present

## 2016-07-16 MED ORDER — LISINOPRIL-HYDROCHLOROTHIAZIDE 20-25 MG PO TABS: 1.0000 | ORAL_TABLET | Freq: Every day | ORAL | 3 refills | Status: DC

## 2016-07-16 NOTE — Progress Notes (Signed)
Pre visit review using our clinic review tool, if applicable. No additional management support is needed unless otherwise documented below in the visit note. 

## 2016-07-16 NOTE — Progress Notes (Signed)
   Subjective:    Patient ID: Steven Solis, male    DOB: Dec 27, 1970, 46 y.o.   MRN: [id number]  HPI Here to follow up on HTN. He has been taking Lisinopril and he feels fine. He has been wearing his CPAP every night and he feels a little more energy than before. We are trying to get hi BP down so he can pass his DOT exam.    Review of Systems  Constitutional: Negative.   Respiratory: Negative.   Cardiovascular: Negative.   Neurological: Negative.        Objective:   Physical Exam  Constitutional: He is oriented to person, place, and time. He appears well-developed and well-nourished.  Cardiovascular: Normal rate, regular rhythm, normal heart sounds and intact distal pulses.   Pulmonary/Chest: Effort normal and breath sounds normal.  Neurological: He is alert and oriented to person, place, and time.          Assessment & Plan:  His HTN has improved but is not to goal. We will change his med to Lisinopril HCT. Recheck in one week.  Gershon Crane, MD

## 2016-07-16 NOTE — Patient Instructions (Signed)
WE NOW OFFER   [facility] FAST TRACK!!!  SAME DAY Appointments for ACUTE CARE  Such as: Sprains, Injuries, cuts, abrasions, rashes, muscle pain, joint pain, back pain Colds, flu, sore throats, headache, allergies, cough, fever  Ear pain, sinus and eye infections Abdominal pain, nausea, vomiting, diarrhea, upset stomach Animal/insect bites  3 Easy Ways to Schedule: Walk-In Scheduling Call in scheduling Mychart Sign-up: [email or web address]         

## 2016-07-24 ENCOUNTER — Ambulatory Visit: Payer: 59 | Admitting: Family Medicine

## 2016-07-25 ENCOUNTER — Ambulatory Visit (INDEPENDENT_AMBULATORY_CARE_PROVIDER_SITE_OTHER): Payer: 59 | Admitting: Family Medicine

## 2016-07-25 ENCOUNTER — Telehealth: Payer: Self-pay | Admitting: Pulmonary Disease

## 2016-07-25 ENCOUNTER — Encounter: Payer: Self-pay | Admitting: Family Medicine

## 2016-07-25 VITALS — BP 138/88 | Temp 98.0°F | Ht 74.0 in | Wt 339.0 lb

## 2016-07-25 DIAGNOSIS — I10 Essential (primary) hypertension: Secondary | ICD-10-CM

## 2016-07-25 MED ORDER — LISINOPRIL-HYDROCHLOROTHIAZIDE 20-25 MG PO TABS: 1.0000 | ORAL_TABLET | Freq: Every day | ORAL | 3 refills | Status: DC

## 2016-07-25 NOTE — Progress Notes (Signed)
   Subjective:    Patient ID: Steven Solis, male    DOB: 04/30/1970, 46 y.o.   MRN: [id number]  HPI Here to follow up on HTN. We have been adjusting his medication and he is now taking Lisinopril HCT 20/25 daily. He feels fine and his BP is now well controlled. He has no concerns and he is anxious to get back to work asap. 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 Of note he has not taken any Phentermine in the past 5 months.    Review of Systems  Constitutional: Negative.   Respiratory: Negative.   Cardiovascular: Negative.   Neurological: Negative.        Objective:   Physical Exam  Constitutional: He is oriented to person, place, and time. He appears well-developed and well-nourished.  Neck: No thyromegaly present.  Cardiovascular: Normal rate, regular rhythm, normal heart sounds and intact distal pulses.   Pulmonary/Chest: Effort normal and breath sounds normal. No respiratory distress. He has no wheezes. He has no rales.  Musculoskeletal: He exhibits no edema.  Lymphadenopathy:    He has no cervical adenopathy.  Neurological: He is alert and oriented to person, place, and time.          Assessment & Plan:  His HTN is now well controlled. He is cleared to drive with no restrictions as of today. Recheck in 90 days. Gershon CraneStephen Fry, MD

## 2016-07-25 NOTE — Patient Instructions (Signed)
WE NOW OFFER    [facility] FAST TRACK!!!  SAME DAY Appointments for ACUTE CARE  Such as: Sprains, Injuries, cuts, abrasions, rashes, muscle pain, joint pain, back pain Colds, flu, sore throats, headache, allergies, cough, fever  Ear pain, sinus and eye infections Abdominal pain, nausea, vomiting, diarrhea, upset stomach Animal/insect bites  3 Easy Ways to Schedule: Walk-In Scheduling Call in scheduling Mychart Sign-up: [email or web address]         

## 2016-07-25 NOTE — Telephone Encounter (Signed)
lmtcb x1 for pt. Download has been placed in brown folder up front for pickup.

## 2016-07-26 NOTE — Telephone Encounter (Signed)
Pt aware. Nothing further needed 

## 2016-07-30 ENCOUNTER — Telehealth: Payer: Self-pay | Admitting: Family Medicine

## 2016-07-30 NOTE — Telephone Encounter (Signed)
I spoke with pt, he needs letter on our office letterhead with following information- how are you treating pt's blood pressure, not been on phentermine since 02/2016, he is cleared to drive. Please call pt when ready.

## 2016-07-30 NOTE — Telephone Encounter (Signed)
Pt would like you to call concerning some letters he needs Dr Clent RidgesFry to write.  Pt states too much info to give to me.    ALSO Pt request refill  sildenafil (VIAGRA) 100 MG tablet  [facility]/ [place]

## 2016-07-31 MED ORDER — SILDENAFIL CITRATE 100 MG PO TABS: ORAL_TABLET | ORAL | 6 refills | Status: DC

## 2016-07-31 NOTE — Telephone Encounter (Signed)
Letter is ready for pick up here at front office, [facility] and I left a voice message for pt with this information.

## 2016-07-31 NOTE — Telephone Encounter (Signed)
Okay per Dr. Clent RidgesFry to refill Viagra for # 10 with 6 refills.

## 2016-07-31 NOTE — Telephone Encounter (Signed)
This letter is ready  

## 2016-08-31 ENCOUNTER — Telehealth: Payer: Self-pay | Admitting: Adult Health

## 2016-08-31 NOTE — Telephone Encounter (Signed)
CPAP download has been printed and placed up front for patient to pick up.  Left detailed message on pt's VM.

## 2016-10-25 ENCOUNTER — Ambulatory Visit: Payer: Self-pay | Admitting: Family Medicine

## 2016-11-12 ENCOUNTER — Ambulatory Visit: Payer: Self-pay | Admitting: Adult Health

## 2016-12-25 ENCOUNTER — Encounter: Payer: Self-pay | Admitting: Family Medicine

## 2016-12-25 ENCOUNTER — Ambulatory Visit (INDEPENDENT_AMBULATORY_CARE_PROVIDER_SITE_OTHER): Payer: 59 | Admitting: Family Medicine

## 2016-12-25 VITALS — BP 132/88 | HR 62 | Temp 98.4°F | Ht 74.0 in | Wt 334.0 lb

## 2016-12-25 DIAGNOSIS — I10 Essential (primary) hypertension: Secondary | ICD-10-CM | POA: Diagnosis not present

## 2016-12-25 MED ORDER — AMLODIPINE BESYLATE 5 MG PO TABS: 5.0000 mg | ORAL_TABLET | Freq: Every day | ORAL | 5 refills | Status: DC

## 2016-12-25 NOTE — Progress Notes (Signed)
   Subjective:    Patient ID: Steven Solis, male    DOB: 1970-05-28, 46 y.o.   MRN: [id number]  HPI Here for possible side effects of his BP medication. It has been working well as far as the BP goes. However he has had several instances when he works outside in his yard where he feels weak and lightheaded and has to come in to lie down. This is despite drinking lots of water. He also mentions that he loses his voice almost every day around lunchtime. No coughing or SOB.    Review of Systems  Constitutional: Negative.   HENT: Positive for voice change.   Eyes: Negative.   Respiratory: Negative.   Cardiovascular: Negative.        Objective:   Physical Exam  Constitutional: He is oriented to person, place, and time. He appears well-developed and well-nourished.  Neck: No thyromegaly present.  Cardiovascular: Normal rate, regular rhythm, normal heart sounds and intact distal pulses.   Pulmonary/Chest: Effort normal and breath sounds normal. No respiratory distress. He has no wheezes. He has no rales.  Lymphadenopathy:    He has no cervical adenopathy.  Neurological: He is alert and oriented to person, place, and time.          Assessment & Plan:  He is having possible side effects from the Lisinopril HCT so we will stop this. Start on Amlodipine 5 mg daily. He will report back to [name] in 3-4 weeks.  Gershon Crane, MD

## 2016-12-25 NOTE — Patient Instructions (Signed)
WE NOW OFFER   [facility] FAST TRACK!!!  SAME DAY Appointments for ACUTE CARE  Such as: Sprains, Injuries, cuts, abrasions, rashes, muscle pain, joint pain, back pain Colds, flu, sore throats, headache, allergies, cough, fever  Ear pain, sinus and eye infections Abdominal pain, nausea, vomiting, diarrhea, upset stomach Animal/insect bites  3 Easy Ways to Schedule: Walk-In Scheduling Call in scheduling Mychart Sign-up: [email or web address]         

## 2017-06-18 ENCOUNTER — Other Ambulatory Visit: Payer: Self-pay | Admitting: Family Medicine

## 2017-06-18 NOTE — Telephone Encounter (Signed)
Last OV 12/25/2016   Viagra last refilled 07/31/2016 disp 10 with 6 refills   Norvasc last refilled 12/25/2016 disp 30 with 5 refills   Sent to PCP for approval

## 2017-07-17 ENCOUNTER — Other Ambulatory Visit: Payer: Self-pay | Admitting: Family Medicine

## 2017-07-17 NOTE — Telephone Encounter (Signed)
Last OV 12/25/2016   Last refilled 06/18/2017

## 2017-08-09 ENCOUNTER — Telehealth: Payer: Self-pay | Admitting: *Deleted

## 2017-08-09 NOTE — Telephone Encounter (Signed)
Prior auth for Sildenafil  sent to [email or web address].  Approval given at the time of submission and noted as below: CaseId:[id number];Status:Approved;Review Type:Prior Auth;Coverage Start Date:07/10/2017;Coverage End Date:08/09/2018.  I called [facility] in [place] and left a detailed message with this information.

## 2018-08-04 ENCOUNTER — Telehealth: Payer: Self-pay | Admitting: Family Medicine

## 2018-08-04 NOTE — Telephone Encounter (Signed)
PA approved for the sildenafil 100 mg  For 8 tablets per month.

## 2018-11-15 IMAGING — MR MR LUMBAR SPINE W/O CM
4 of 5 series · 18 of 48 positions shown · non-contrast
Comparison: None.

CLINICAL DATA: [age] year old male with left low back pain and
pain and numbness down left leg for 2 months after bending down to
put on shoes. Initial encounter.

EXAM:
MRI LUMBAR SPINE WITHOUT CONTRAST
TECHNIQUE: Multiplanar, multisequence MR imaging of the lumbar spine was
performed. No intravenous contrast was administered.

[Series 6: T2 · sagittal · 4.0mm · 0.73mm/px · 6 of 15 slices shown (1 of 2)]
[im 1/15]
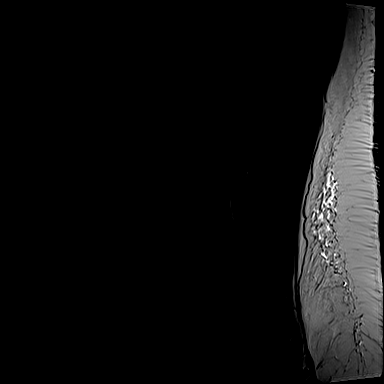
[im 3/15]
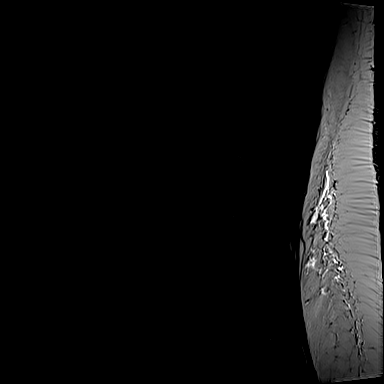
[im 6/15]
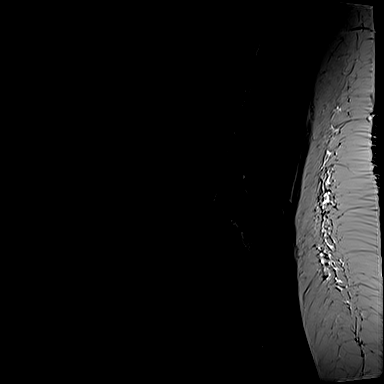
[im 9/15]
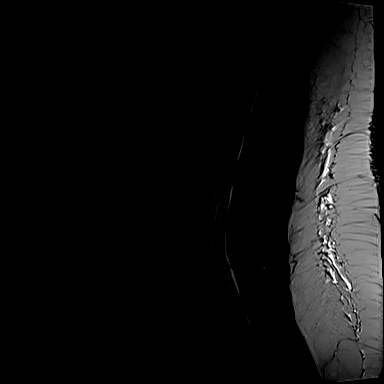
[im 12/15]
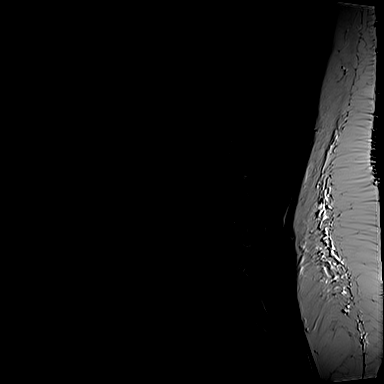
[im 15/15]
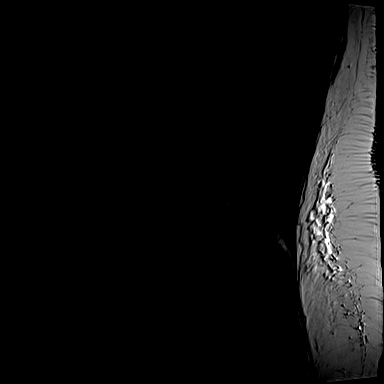

[Series 7: T1 · sagittal · 4.0mm · 0.73mm/px · 3 of 15 slices shown (1 of 2)]
[im 3/15]
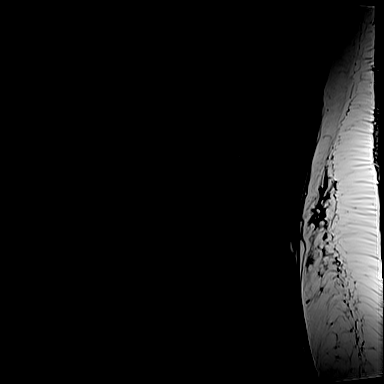
[im 9/15]
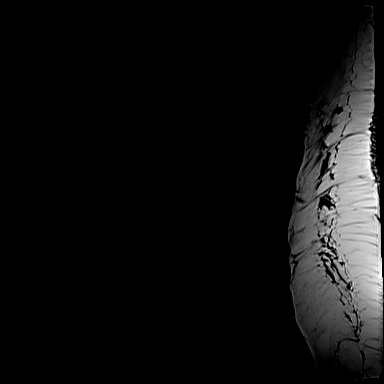
[im 15/15]
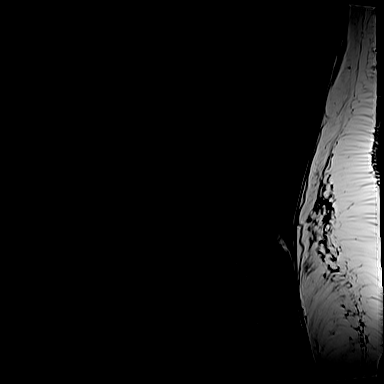

[Series 11: T1 · axial · 4.0mm · 0.28mm/px · z∈[-61,+85]mm · 3 of 36 slices shown (2 of 2)]
[im 6/36]
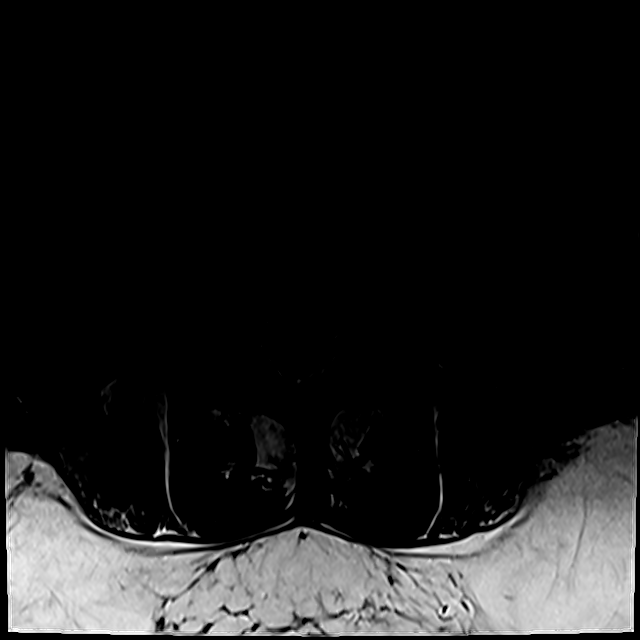
[im 18/36]
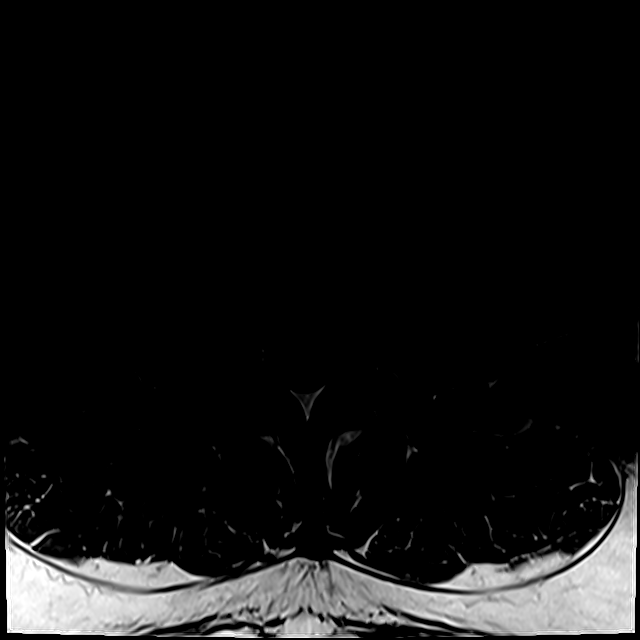
[im 31/36]
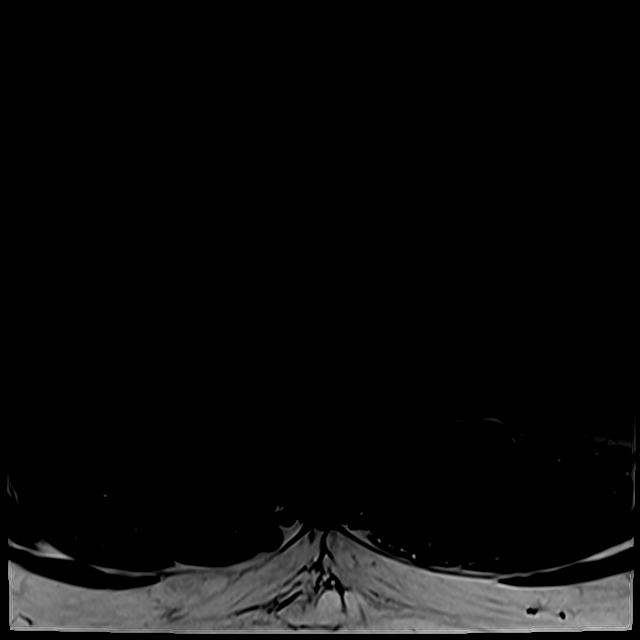

[Series 14: T2 · axial · 4.0mm · 0.28mm/px · z∈[-86,+85]mm · 6 of 36 slices shown (2 of 2)]
[im 1/36]
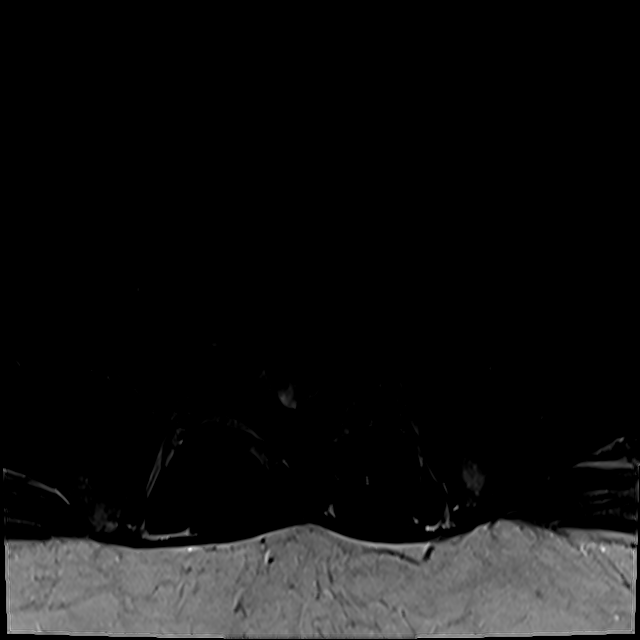
[im 6/36]
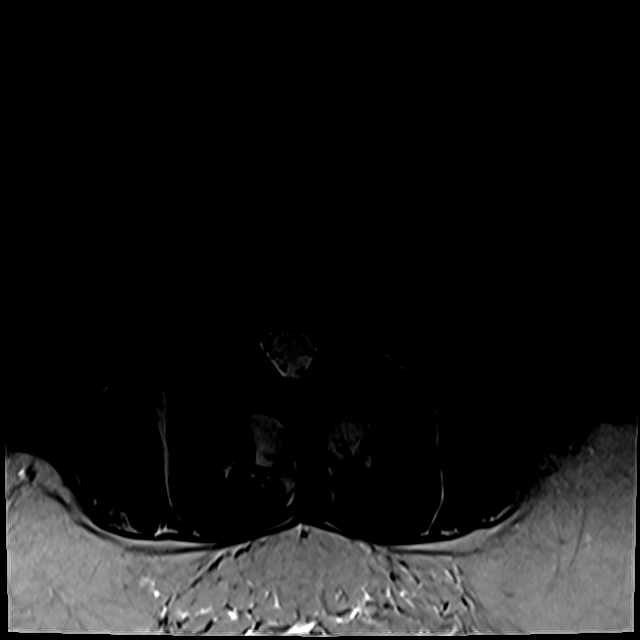
[im 11/36]
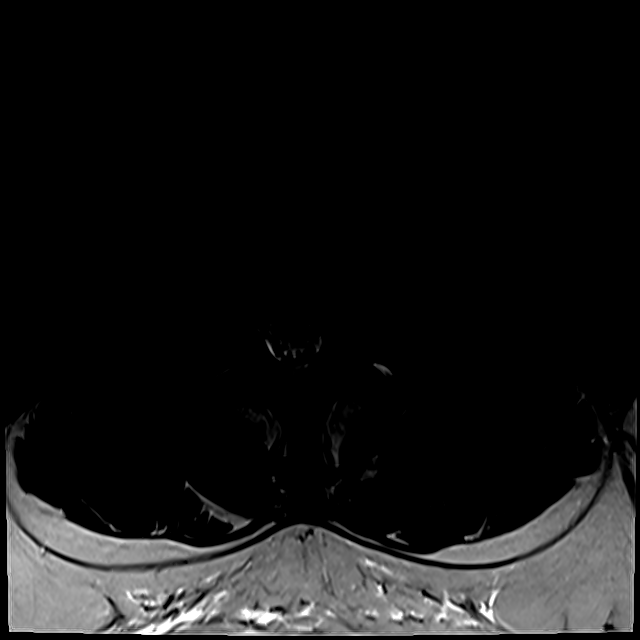
[im 16/36]
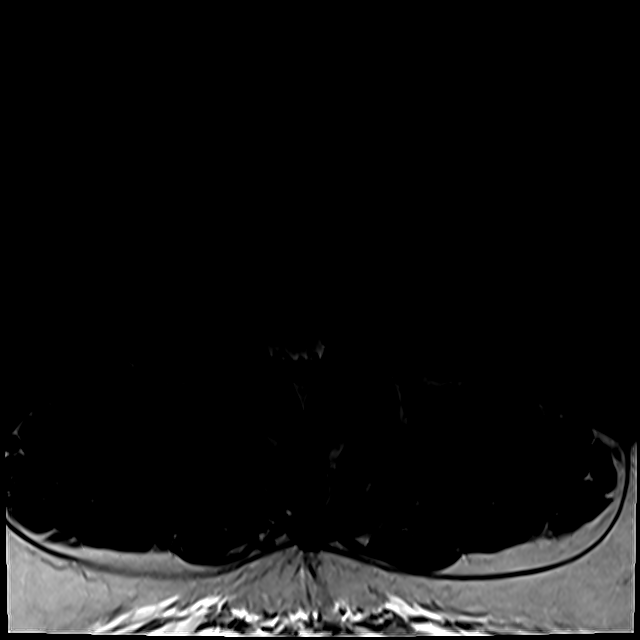
[im 18/36]
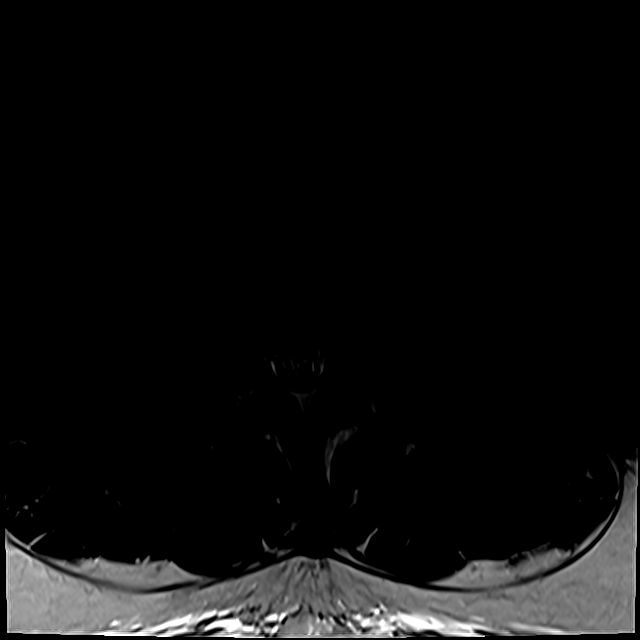
[im 31/36]
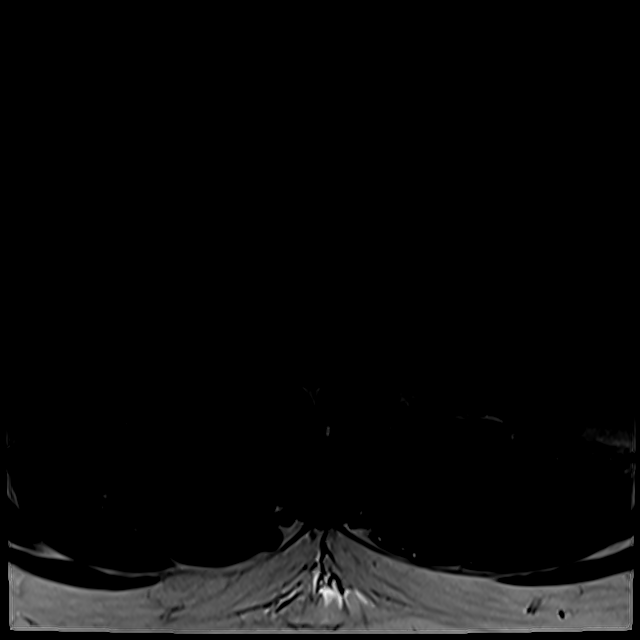

[18 of 48 positions shown; findings below may reference images not displayed]

FINDINGS: Segmentation: Last fully open disk space is labeled L5-S1. Present
examination incorporates from T11-12 disc space through the lower
sacrum. Rudimentary disc at the S1-2 level.

Alignment:  Normal.

Vertebrae:  No worrisome abnormality.

Conus medullaris: Extends to the L1-2 level and appears normal.

Paraspinal and other soft tissues: Possible inferior left renal cyst
incompletely assessed.

Disc levels:

T11-12: Minimal Schmorl's node deformity.  Minimal bulge.

T12-L1:  Minimal Schmorl's node deformity.

L1-2:  Negative.

L2-3: Minimal bulge greatest left paracentral position. Minimal
indentation left ventral thecal sac. Minimal foraminal narrowing
without nerve root compression.

L3-4: Mild bulge with foraminal extension greater on the left
crowding the course of the exiting left L3 nerve root which does not
appear significantly compressed. Mild facet degenerative changes.

L4-5: Moderate facet degenerative changes greater on left. Bulge
with foraminal/ lateral extension greater on left crowding the
exiting L4 nerve roots more notable on the left. Additionally, there
is a small left paracentral protrusion also contributing to crowding
of the left L4 nerve root. Mild left lateral recess narrowing.

L5-S1: Disc degeneration with disc space narrowing. Bulge with
slight caudal extension minimally more notable on left. No
compression of the thecal sac or S1 nerve roots. Foraminal extension
of bulge/osteophyte encroaching upon but not compressing exiting L5
nerve roots. Moderate facet degenerative changes.
IMPRESSION: Multilevel degenerative changes including (in the decreasing
significance):

L4-5 moderate facet degenerative changes greater on left. Bulge with
foraminal/ lateral extension greater on left crowding the exiting L4
nerve roots more notable on the left. Additionally, there is a small
left paracentral protrusion also contributing to crowding of the
left L4 nerve root. Mild left lateral recess narrowing.

L3-4 mild bulge with foraminal extension greater on the left
crowding the course of the exiting left L3 nerve root which does not
appear significantly compressed. Mild facet degenerative changes.

L5-S1 bulge with slight caudal extension minimally more notable on
left. No compression of the thecal sac or S1 nerve roots. Foraminal
extension of bulge/osteophyte encroaching upon but not compressing
exiting L5 nerve roots. Moderate facet degenerative changes.

L2-3 minimal bulge greatest left paracentral position. Minimal
indentation left ventral thecal sac.

## 2019-07-01 ENCOUNTER — Encounter: Payer: Self-pay | Admitting: Family Medicine

## 2019-08-03 ENCOUNTER — Encounter: Payer: Self-pay | Admitting: Family Medicine

## 2019-08-03 ENCOUNTER — Ambulatory Visit (INDEPENDENT_AMBULATORY_CARE_PROVIDER_SITE_OTHER): Payer: 59 | Admitting: Family Medicine

## 2019-08-03 ENCOUNTER — Other Ambulatory Visit: Payer: Self-pay

## 2019-08-03 VITALS — BP 160/90 | HR 75 | Temp 98.1°F | Wt 348.6 lb

## 2019-08-03 DIAGNOSIS — E1165 Type 2 diabetes mellitus with hyperglycemia: Secondary | ICD-10-CM

## 2019-08-03 DIAGNOSIS — I10 Essential (primary) hypertension: Secondary | ICD-10-CM | POA: Diagnosis not present

## 2019-08-03 LAB — POCT GLYCOSYLATED HEMOGLOBIN (HGB A1C): Hemoglobin A1C: 6 % — AB (ref 4.0–5.6)

## 2019-08-03 MED ORDER — LOSARTAN POTASSIUM 50 MG PO TABS: 50.0000 mg | ORAL_TABLET | Freq: Every day | ORAL | 3 refills | Status: DC

## 2019-08-03 MED ORDER — SILDENAFIL CITRATE 100 MG PO TABS: ORAL_TABLET | ORAL | 11 refills | Status: DC

## 2019-08-03 NOTE — Progress Notes (Signed)
   Subjective:    Patient ID: Steven Solis, male    DOB: 1970-07-11, 49 y.o.   MRN: [id number]  HPI Here at the direction of [facility] for elevated glucose and BP. Juanluis feels well in general but he admits to a poor diet and gain a lot of weight. He saw the occupational clinic on 07-01-19 and his BP was 161/111 and his fasting glucose was 159. His TG were 442 but surprisingly his LDL was only 67. He was told to see [name] and that we need to certify him as being fit to drive when these problems are under control. His A1c this morning is 6.0. He stopped taking Amlodipine months ago because it made him feel tired.   Review of Systems  Constitutional: Negative.   Respiratory: Negative.   Cardiovascular: Negative.   Neurological: Negative.        Objective:   Physical Exam Constitutional:      Appearance: He is obese. He is not ill-appearing.  Cardiovascular:     Rate and Rhythm: Normal rate and regular rhythm.     Pulses: Normal pulses.     Heart sounds: Normal heart sounds.  Pulmonary:     Effort: Pulmonary effort is normal.     Breath sounds: Normal breath sounds.  Musculoskeletal:     Comments: Trace edema in both ankles   Neurological:     Mental Status: He is alert.           Assessment & Plan:  He has uncontrolled HTN, morbid obesity, and new onset type 2 diabetes. He has already changed his diet somewhat and he has lost 8 lbs in the last few weeks. We decided not to start him on Metformin at this point. We will refer him to Nutrition to assist with this. For the HTN he will start on Losartan 50 mg daily. Recheck in 2 weeks.  Gershon Crane, MD

## 2019-08-14 ENCOUNTER — Other Ambulatory Visit: Payer: Self-pay

## 2019-08-18 ENCOUNTER — Ambulatory Visit (INDEPENDENT_AMBULATORY_CARE_PROVIDER_SITE_OTHER): Payer: 59 | Admitting: Family Medicine

## 2019-08-18 ENCOUNTER — Encounter: Payer: Self-pay | Admitting: Family Medicine

## 2019-08-18 ENCOUNTER — Other Ambulatory Visit: Payer: Self-pay

## 2019-08-18 VITALS — BP 138/88 | HR 76 | Temp 98.0°F | Wt 349.0 lb

## 2019-08-18 DIAGNOSIS — I10 Essential (primary) hypertension: Secondary | ICD-10-CM | POA: Diagnosis not present

## 2019-08-18 DIAGNOSIS — E1165 Type 2 diabetes mellitus with hyperglycemia: Secondary | ICD-10-CM

## 2019-08-18 MED ORDER — LOSARTAN POTASSIUM 100 MG PO TABS: 100.0000 mg | ORAL_TABLET | Freq: Every day | ORAL | 3 refills | Status: DC

## 2019-08-18 NOTE — Progress Notes (Signed)
   Subjective:    Patient ID: Lannette Donath, male    DOB: January 29, 1971, 49 y.o.   MRN: [id number]  HPI Here to follow up. 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 On 08-03-19 his A1c was 6.0, and he continues to work on weight loss. He has been taking Losartan 50 mg daily and he tolerates this well.    Review of Systems  Constitutional: Negative.   Respiratory: Negative.   Cardiovascular: Negative.   Neurological: Negative.        Objective:   Physical Exam Constitutional:      Appearance: Normal appearance.  Cardiovascular:     Rate and Rhythm: Normal rate and regular rhythm.     Pulses: Normal pulses.     Heart sounds: Normal heart sounds.  Pulmonary:     Effort: Pulmonary effort is normal.     Breath sounds: Normal breath sounds.  Neurological:     Mental Status: He is alert.           Assessment & Plan:  His DM is well controlled. His BP is much improeved but we will increase the Losartan to 100 mg daily. He is cleared to drive as of today, and we generated a letter for work stating this.  Gershon Crane, MD  .

## 2019-09-17 ENCOUNTER — Encounter: Payer: 59 | Attending: Family Medicine | Admitting: Registered"

## 2019-09-17 ENCOUNTER — Other Ambulatory Visit: Payer: Self-pay

## 2019-09-17 ENCOUNTER — Encounter: Payer: Self-pay | Admitting: Registered"

## 2019-09-17 DIAGNOSIS — Z713 Dietary counseling and surveillance: Secondary | ICD-10-CM | POA: Diagnosis present

## 2019-09-17 NOTE — Progress Notes (Signed)
Medical Nutrition Therapy:  Appt start time: 9:35 end time:  10:15.   Assessment:  Primary concerns today: Per referral, recent labs from 07/01/19 were elevated blood pressure (161/111), elevated fasting BG (159), and elevated triglycerides (442). On 08/03/19, elevated A1c (6.0).   Pt reports he went for yearly exam and labs revealed his glucose had spilled over into urine. States he has been taking fish oil since 06/2019 as recommended.   States he lives away from home most days of the month and comes home for 4 days due to work. While away, pt lives in [place] with access to a small fridge and microwave. States he is unable to cook things. Reports working 89-96 hours/week. Sleeps about 6-7 hrs/night.   Reports he is trying not to buy anything to snack on while in hotel room because ff he has it he will eat it. States he keeps a bag of apples in drawer.   States mom was diagnosed with diabetes in her mid 26's. States he does not want to deal with this. Reports when he heard diabetes, it scared him. Reports he does not have time to exercise and wants to make changes in other areas. States he recently weighed 341 lbs. Reports he has lost 15 lbs since physical in 06/2019.  Married with 4 children.    Preferred Learning Style:   No preference indicated   Learning Readiness:   Ready  Change in progress   MEDICATIONS: See list   DIETARY INTAKE:  Usual eating pattern includes 3 meals and 0-1 snack per day.  Everyday foods include bagel, cereal, eggs, bacon, salad, tuna, sandwich, and chips.  Avoided foods include none identified.    24-hr recall:  B (6:20 AM): cereal or  bagel or eggs or bacon  Snk ( AM):   L (12 PM): Lindie Spruce - grilled chicken ceasar salad + water  Snk ( PM):  D (8:30 PM): ham and cheese sandwich (with mayo) + chips or can of tuna (with mayo) + chips Snk ( PM):  Beverages: water (6-8*16 oz; 64+ oz), beer (occasional)  Usual physical activity: yard work when coming  home  Estimated energy needs: 2200-2400 calories 248-270 g carbohydrates 165-180 g protein 61-67 g fat  Progress Towards Goal(s):  In progress.   Nutritional Diagnosis:  NB-1.1 Food and nutrition-related knowledge deficit As related to prediabetes.  As evidenced by elevated A1c of 6.0.    Intervention:  Pt was educated and counseled on metabolism, how carbohydrates work in the body, A1c ranges for prediabetes/diabetes, role of fiber in eating regimen, and importance of physical activity. Discussed meal/snack planning and how to balance meals/snacks using MyPlate for prediabetes. Pt was in agreement with goals listed. Goals: - Aim to not go longer than 5 hours without eating.  - Snacks to include source of carbohydrates + protein such as:  Peanut butter + crackers  Cheese + crackers  Fruit + peanut butter  Fruit + nuts  Trail mix  - Aim to have meal as non-starchy vegetables for dinner. Such as:  Side salad  Carrots  Tomatoes  Broccoli  Cucumbers - Alternate between can of tuna and tuna packs to reduce mayo intake.  - Use multigrain or whole grain crackers as option with tuna instead of chips.   Teaching Method Utilized:  Visual Auditory Hands on  Handouts given during visit include:  My Plate for Prediabetes  Barriers to learning/adherence to lifestyle change: work-life balance  Demonstrated degree of understanding via:  Teach Back  Monitoring/Evaluation:  Dietary intake, exercise, and body weight prn.

## 2019-09-17 NOTE — Patient Instructions (Addendum)
-   Aim to not go longer than 5 hours without eating.   - Snacks to include source of carbohydrates + protein such as:  Peanut butter + crackers  Cheese + crackers  Fruit + peanut butter  Fruit + nuts  Trail mix   - Aim to have meal as non-starchy vegetables for dinner. Such as:  Side salad  Carrots  Tomatoes  Broccoli  Cucumbers  - Alternate between can of tuna and tuna packs to reduce mayo intake.   - Use multigrain or whole grain crackers as option with tuna instead of chips.

## 2019-09-25 ENCOUNTER — Telehealth: Payer: Self-pay

## 2019-09-25 NOTE — Telephone Encounter (Signed)
Harland Dingwall Key: Clerance Lav - PA Case ID: [id number] - Rx #: [id number] Need help? Call us at [phone] Status Sent to [facility] Sildenafil Citrate 100MG  tablets Form [facility] Electronic PA Form (2017 NCPDP) Original Claim Info 75 CALL HELP DESK

## 2019-10-02 NOTE — Telephone Encounter (Signed)
PA has been approved.  Approvedon July 9 CaseId:[id number];Status:Approved;Review Type:Prior Auth;Coverage Start Date:08/26/2019;Coverage End Date:09/24/2020;  Spoke with the patient, he is aware that the PA was approved.

## 2020-07-05 ENCOUNTER — Ambulatory Visit (INDEPENDENT_AMBULATORY_CARE_PROVIDER_SITE_OTHER): Payer: 59 | Admitting: Family Medicine

## 2020-07-05 ENCOUNTER — Encounter: Payer: Self-pay | Admitting: Family Medicine

## 2020-07-05 ENCOUNTER — Other Ambulatory Visit: Payer: Self-pay

## 2020-07-05 VITALS — BP 180/120 | HR 74 | Temp 98.5°F | Wt 345.4 lb

## 2020-07-05 DIAGNOSIS — I10 Essential (primary) hypertension: Secondary | ICD-10-CM | POA: Diagnosis not present

## 2020-07-05 DIAGNOSIS — E1165 Type 2 diabetes mellitus with hyperglycemia: Secondary | ICD-10-CM | POA: Diagnosis not present

## 2020-07-05 LAB — POCT GLYCOSYLATED HEMOGLOBIN (HGB A1C): Hemoglobin A1C: 6.4 % — AB (ref 4.0–5.6)

## 2020-07-05 NOTE — Progress Notes (Signed)
   Subjective:    Patient ID: Steven Solis, male    DOB: January 24, 1971, 50 y.o.   MRN: [id number]  HPI Here to follow up on type 2 diabetes and HTN. In May 2021 his A1c here was 6.0. his HTN had been well controlled by taking Losartan, and his BP in January at [facility] was stable at 132/76. However he admits that he stopped taking the Losartan about 3 months ago. 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 His BP was 181/125 and then 192/130. 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 He feels well in general. His A1c here today is 6.4. He has lost about 6 lbs from a year ago.   Review of Systems  Constitutional: Negative.   Respiratory: Negative.   Cardiovascular: Negative.   Neurological: Negative.        Objective:   Physical Exam Constitutional:      Appearance: He is obese. He is not ill-appearing.  Cardiovascular:     Rate and Rhythm: Normal rate and regular rhythm.     Pulses: Normal pulses.     Heart sounds: Normal heart sounds.  Pulmonary:     Effort: Pulmonary effort is normal.     Breath sounds: Normal breath sounds.  Neurological:     Mental Status: He is alert.           Assessment & Plan:  His diabetes is borderline controlled. I advised him to watch the diet closely and to continue to lose weight. His HTN is not controlled, and he agreed to get back on the Losartan every day. He will see [name] again in 4 weeks to follow up on the BP.  Gershon Crane, MD

## 2020-07-27 ENCOUNTER — Other Ambulatory Visit: Payer: Self-pay

## 2020-07-28 ENCOUNTER — Ambulatory Visit (INDEPENDENT_AMBULATORY_CARE_PROVIDER_SITE_OTHER): Payer: 59 | Admitting: Family Medicine

## 2020-07-28 ENCOUNTER — Encounter: Payer: Self-pay | Admitting: Family Medicine

## 2020-07-28 VITALS — BP 160/110 | HR 72 | Temp 98.7°F | Wt 347.0 lb

## 2020-07-28 DIAGNOSIS — E1165 Type 2 diabetes mellitus with hyperglycemia: Secondary | ICD-10-CM

## 2020-07-28 DIAGNOSIS — I10 Essential (primary) hypertension: Secondary | ICD-10-CM

## 2020-07-28 LAB — POCT GLUCOSE (DEVICE FOR HOME USE): Glucose Fasting, POC: 147 mg/dL — AB (ref 70–99)

## 2020-07-28 MED ORDER — METOPROLOL SUCCINATE ER 50 MG PO TB24: 50.0000 mg | ORAL_TABLET | Freq: Every day | ORAL | 3 refills | Status: DC

## 2020-07-28 NOTE — Progress Notes (Signed)
   Subjective:    Patient ID: Lannette Donath, male    DOB: 1970-12-14, 50 y.o.   MRN: [id number]  HPI Here to follow up on HTN and diabetes. He was here on 07-05-20 and his BP was 180/120. His A1c was 6.4 that day. We started him back on Losartan 100 mg daily and he has felt fine since then. 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 His fasting glucose this morning is 147.    Review of Systems  Constitutional: Negative.   Respiratory: Negative.   Cardiovascular: Negative.        Objective:   Physical Exam Constitutional:      Appearance: He is obese.  Cardiovascular:     Rate and Rhythm: Normal rate and regular rhythm.     Pulses: Normal pulses.     Heart sounds: Normal heart sounds.  Pulmonary:     Effort: Pulmonary effort is normal.     Breath sounds: Normal breath sounds.  Neurological:     Mental Status: He is alert.           Assessment & Plan:  His diabetes is under borderline control. I asked him to tighten up on his diet. For the HTN, we will add Metoprolol succinate 50 mg daily. Recheck in 2 weeks.  Gershon Crane, MD

## 2020-08-11 ENCOUNTER — Other Ambulatory Visit: Payer: Self-pay

## 2020-08-12 ENCOUNTER — Encounter: Payer: Self-pay | Admitting: Family Medicine

## 2020-08-12 ENCOUNTER — Ambulatory Visit (INDEPENDENT_AMBULATORY_CARE_PROVIDER_SITE_OTHER): Payer: 59 | Admitting: Family Medicine

## 2020-08-12 VITALS — BP 142/88 | HR 63 | Temp 98.3°F | Wt 344.0 lb

## 2020-08-12 DIAGNOSIS — E1165 Type 2 diabetes mellitus with hyperglycemia: Secondary | ICD-10-CM

## 2020-08-12 DIAGNOSIS — I10 Essential (primary) hypertension: Secondary | ICD-10-CM | POA: Diagnosis not present

## 2020-08-12 NOTE — Progress Notes (Signed)
   Subjective:    Patient ID: Steven Solis, male    DOB: 06-Jul-1970, 50 y.o.   MRN: [id number]  HPI Here to follow up on HTN. At our last visit we started him on Metoprolol succinate 50 mg daily and he has done well on this. His BP today is down to an acceptable range.    Review of Systems  Constitutional: Negative.   Respiratory: Negative.   Cardiovascular: Negative.        Objective:   Physical Exam Constitutional:      Appearance: Normal appearance.  Cardiovascular:     Rate and Rhythm: Normal rate and regular rhythm.     Pulses: Normal pulses.     Heart sounds: Normal heart sounds.  Pulmonary:     Effort: Pulmonary effort is normal.     Breath sounds: Normal breath sounds.  Neurological:     Mental Status: He is alert.           Assessment & Plan:  His HTN is now adequately controlled and he feels well. 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  Gershon Crane, MD

## 2020-08-30 ENCOUNTER — Other Ambulatory Visit: Payer: Self-pay | Admitting: Family Medicine

## 2020-08-31 ENCOUNTER — Other Ambulatory Visit: Payer: Self-pay

## 2020-08-31 MED ORDER — SILDENAFIL CITRATE 100 MG PO TABS: ORAL_TABLET | ORAL | 6 refills | Status: DC

## 2020-08-31 NOTE — Telephone Encounter (Deleted)
Can a refill be submitted for Losartan 100

## 2020-09-13 ENCOUNTER — Telehealth: Payer: Self-pay | Admitting: Family Medicine

## 2020-09-13 NOTE — Telephone Encounter (Signed)
Pt is calling in stating that his insurance company it wanting him to use [facility] for his prescription and he is needing for Rx's losartan (COZAAR) 100 MG  and sildenafil (VIAGRA) 100 MG to be called in to them.  NEW PHARMACY: [facility]

## 2020-09-14 ENCOUNTER — Other Ambulatory Visit: Payer: Self-pay

## 2020-09-14 MED ORDER — SILDENAFIL CITRATE 100 MG PO TABS: ORAL_TABLET | ORAL | 6 refills | Status: DC

## 2020-09-14 MED ORDER — LOSARTAN POTASSIUM 100 MG PO TABS: 100.0000 mg | ORAL_TABLET | Freq: Every day | ORAL | 1 refills | Status: DC

## 2020-09-14 NOTE — Telephone Encounter (Signed)
Medications have been sent to [facility]. °

## 2020-09-21 ENCOUNTER — Other Ambulatory Visit: Payer: Self-pay

## 2020-09-21 ENCOUNTER — Telehealth: Payer: Self-pay

## 2020-09-21 NOTE — Telephone Encounter (Signed)
Key: [id number] Pt PA for sildenafil was sent to pt plan for approval

## 2021-03-17 ENCOUNTER — Telehealth: Payer: Self-pay | Admitting: Family Medicine

## 2021-03-17 NOTE — Telephone Encounter (Signed)
Patient calling in with respiratory symptoms: Shortness of breath, chest pain, palpitations or other red words send to Triage  Does the patient have a fever over 100, cough, congestion, sore throat, runny nose, lost of taste/smell (please list symptoms that patient has)?sinus congestion did symptoms start? 03-03-2021 (If over 5 days ago, pt may be scheduled for in person visit)  Have you tested for Covid in the last 5 days? No   If yes, was it positive []  OR negative [] ? If positive in the last 5 days, please schedule virtual visit now. If negative, schedule for an in person OV with the next available provider if PCP has no openings. Please also let patient know they will be tested again (follow the script below)  "you will have to arrive prior to your appt time to be Covid tested. Please park in back of office at the cone & call [phone] to let the staff know you have arrived. A staff member will meet you at your car to do a rapid covid test. Once the test has resulted you will be notified by phone of your results to determine if appt will remain an in person visit or be converted to a virtual/phone visit. If you arrive less than before your appt time, your visit will be automatically converted to virtual & any recommended testing will happen AFTER the visit." Pt has an appt with dr fry on 03-22-2020 at 830 am THINGS TO REMEMBER  If no availability for virtual visit in office,  please schedule another [place] office  If no availability at another [place] office, please instruct patient that they can schedule an evisit or virtual visit through their mychart account. Visits up to 8pm  patients can be seen in office 5 days after positive COVID test

## 2021-03-22 ENCOUNTER — Ambulatory Visit (INDEPENDENT_AMBULATORY_CARE_PROVIDER_SITE_OTHER): Payer: 59 | Admitting: Family Medicine

## 2021-03-22 ENCOUNTER — Encounter: Payer: Self-pay | Admitting: Family Medicine

## 2021-03-22 VITALS — BP 146/88 | HR 78 | Temp 98.5°F | Ht 74.0 in | Wt 345.0 lb

## 2021-03-22 DIAGNOSIS — J40 Bronchitis, not specified as acute or chronic: Secondary | ICD-10-CM

## 2021-03-22 MED ORDER — METOPROLOL SUCCINATE ER 50 MG PO TB24: 50.0000 mg | ORAL_TABLET | Freq: Every day | ORAL | 3 refills | Status: DC

## 2021-03-22 MED ORDER — DOXYCYCLINE HYCLATE 100 MG PO CAPS: 100.0000 mg | ORAL_CAPSULE | Freq: Two times a day (BID) | ORAL | 0 refills | Status: AC

## 2021-03-22 MED ORDER — LOSARTAN POTASSIUM 100 MG PO TABS: 100.0000 mg | ORAL_TABLET | Freq: Every day | ORAL | 3 refills | Status: DC

## 2021-03-22 NOTE — Progress Notes (Signed)
° °  Subjective:    Patient ID: Lannette Donath, male    DOB: 16-Jun-1970, 51 y.o.   MRN: [id number]  HPI Here for 2 weeks of coughing which is dry for the most part. No chest pain or SOB. No fever. Using Nyquil, Sudafed, and Mucinex.    Review of Systems  Constitutional: Negative.   HENT: Negative.    Eyes: Negative.   Respiratory:  Positive for cough and chest tightness. Negative for shortness of breath and wheezing.   Cardiovascular: Negative.       Objective:   Physical Exam Constitutional:      Appearance: Normal appearance. He is not ill-appearing.  HENT:     Right Ear: Tympanic membrane, ear canal and external ear normal.     Left Ear: Tympanic membrane, ear canal and external ear normal.     Nose: Nose normal.     Mouth/Throat:     Pharynx: Oropharynx is clear.  Eyes:     Conjunctiva/sclera: Conjunctivae normal.  Pulmonary:     Effort: Pulmonary effort is normal.     Breath sounds: Normal breath sounds.  Lymphadenopathy:     Cervical: No cervical adenopathy.  Neurological:     Mental Status: He is alert.          Assessment & Plan:  Bronchitis. Treat with Doxycycline. Recheck as needed.  Gershon Crane, MD

## 2021-04-04 ENCOUNTER — Other Ambulatory Visit: Payer: Self-pay

## 2021-04-04 ENCOUNTER — Telehealth: Payer: Self-pay | Admitting: Family Medicine

## 2021-04-04 DIAGNOSIS — J40 Bronchitis, not specified as acute or chronic: Secondary | ICD-10-CM

## 2021-04-04 MED ORDER — DOXYCYCLINE HYCLATE 100 MG PO TABS: ORAL_TABLET | ORAL | 0 refills | Status: DC

## 2021-04-04 NOTE — Telephone Encounter (Signed)
Call in Doxycycline 100 mg BID for 10 days  

## 2021-04-04 NOTE — Telephone Encounter (Signed)
Pharmacy updated.

## 2021-04-04 NOTE — Telephone Encounter (Signed)
Patient called because he is still having the cough and congestion. Patient would like a refill on doxycycline (VIBRAMYCIN) 100 MG capsule   Please send to  [facility] [address] [place], [place], [place] [address]   [place]   Please advise

## 2021-04-04 NOTE — Telephone Encounter (Signed)
Doxycycline 100mg  sent to requested [facility] pharmacy.  Called patient vm said , didn't leave message due to not stating patient name.

## 2021-08-09 ENCOUNTER — Telehealth: Payer: Self-pay | Admitting: Family Medicine

## 2021-08-09 ENCOUNTER — Other Ambulatory Visit: Payer: Self-pay

## 2021-08-09 MED ORDER — SILDENAFIL CITRATE 100 MG PO TABS: ORAL_TABLET | ORAL | 1 refills | Status: DC

## 2021-08-09 MED ORDER — METOPROLOL SUCCINATE ER 50 MG PO TB24: 50.0000 mg | ORAL_TABLET | Freq: Every day | ORAL | 0 refills | Status: DC

## 2021-08-09 MED ORDER — LOSARTAN POTASSIUM 100 MG PO TABS: 100.0000 mg | ORAL_TABLET | Freq: Every day | ORAL | 0 refills | Status: DC

## 2021-08-09 NOTE — Telephone Encounter (Signed)
Pt needs refills on his losartan (COZAAR) 100 MG tablet  and metoprolol succinate (TOPROL-XL) 50 MG 24 hr tablet , sildenafil (VIAGRA) 100 MG tablet , Adalimumab (HUMIRA PEN [place]) [id number] - [place],  - [address] Phone:  [phone]  Fax:  [phone]    Pt scheduled for OV on 08/25/21

## 2021-08-09 NOTE — Telephone Encounter (Signed)
Pt requesting refill on Humira Pen SQ-  refill was last sent in my historical provider.   Can patient receive a refill?  All other requested refills have been sent.

## 2021-08-10 NOTE — Telephone Encounter (Signed)
I do not prescribe Humira. That comes from his rheumatologist

## 2021-08-10 NOTE — Telephone Encounter (Signed)
Lvm for patient, prescription sent also contact Rheumatology for Humira refill.

## 2021-08-10 NOTE — Telephone Encounter (Signed)
Pt will contact his rheum

## 2021-08-25 ENCOUNTER — Encounter: Payer: Self-pay | Admitting: Family Medicine

## 2021-08-25 ENCOUNTER — Ambulatory Visit (INDEPENDENT_AMBULATORY_CARE_PROVIDER_SITE_OTHER): Payer: BC Managed Care – PPO | Admitting: Family Medicine

## 2021-08-25 VITALS — BP 158/92 | HR 70 | Temp 97.8°F | Ht 74.0 in | Wt 340.0 lb

## 2021-08-25 DIAGNOSIS — Z0000 Encounter for general adult medical examination without abnormal findings: Secondary | ICD-10-CM | POA: Diagnosis not present

## 2021-08-25 DIAGNOSIS — Z23 Encounter for immunization: Secondary | ICD-10-CM

## 2021-08-25 LAB — CBC WITH DIFFERENTIAL/PLATELET
Basophils Absolute: 0.1 10*3/uL (ref 0.0–0.1)
Basophils Relative: 0.7 % (ref 0.0–3.0)
Eosinophils Absolute: 0.3 10*3/uL (ref 0.0–0.7)
Eosinophils Relative: 3.4 % (ref 0.0–5.0)
HCT: 43.6 % (ref 39.0–52.0)
Hemoglobin: 15.1 g/dL (ref 13.0–17.0)
Lymphocytes Relative: 27.3 % (ref 12.0–46.0)
Lymphs Abs: 2.1 10*3/uL (ref 0.7–4.0)
MCHC: 34.7 g/dL (ref 30.0–36.0)
MCV: 83.2 fl (ref 78.0–100.0)
Monocytes Absolute: 0.6 10*3/uL (ref 0.1–1.0)
Monocytes Relative: 7.5 % (ref 3.0–12.0)
Neutro Abs: 4.7 10*3/uL (ref 1.4–7.7)
Neutrophils Relative %: 61.1 % (ref 43.0–77.0)
Platelets: 194 10*3/uL (ref 150.0–400.0)
RBC: 5.24 Mil/uL (ref 4.22–5.81)
RDW: 13.9 % (ref 11.5–15.5)
WBC: 7.7 10*3/uL (ref 4.0–10.5)

## 2021-08-25 LAB — TESTOSTERONE: Testosterone: 186.97 ng/dL — ABNORMAL LOW (ref 300.00–890.00)

## 2021-08-25 LAB — LDL CHOLESTEROL, DIRECT: Direct LDL: 65 mg/dL

## 2021-08-25 LAB — HEPATIC FUNCTION PANEL
ALT: 24 U/L (ref 0–53)
AST: 13 U/L (ref 0–37)
Albumin: 4.3 g/dL (ref 3.5–5.2)
Alkaline Phosphatase: 68 U/L (ref 39–117)
Bilirubin, Direct: 0.2 mg/dL (ref 0.0–0.3)
Total Bilirubin: 1.1 mg/dL (ref 0.2–1.2)
Total Protein: 6.7 g/dL (ref 6.0–8.3)

## 2021-08-25 LAB — BASIC METABOLIC PANEL
BUN: 16 mg/dL (ref 6–23)
CO2: 30 mEq/L (ref 19–32)
Calcium: 9.2 mg/dL (ref 8.4–10.5)
Chloride: 103 mEq/L (ref 96–112)
Creatinine, Ser: 1.05 mg/dL (ref 0.40–1.50)
GFR: 82.19 mL/min (ref >60.00)
Glucose, Bld: 196 mg/dL — ABNORMAL HIGH (ref 70–99)
Potassium: 4.4 mEq/L (ref 3.5–5.1)
Sodium: 141 mEq/L (ref 135–145)

## 2021-08-25 LAB — TSH: TSH: 1.37 u[IU]/mL (ref 0.35–5.50)

## 2021-08-25 LAB — LIPID PANEL
Cholesterol: 140 mg/dL (ref 0–200)
HDL: 34.6 mg/dL — ABNORMAL LOW (ref >39.00)
NonHDL: 105.8
Total CHOL/HDL Ratio: 4
Triglycerides: 295 mg/dL — ABNORMAL HIGH (ref 0.0–149.0)
VLDL: 59 mg/dL — ABNORMAL HIGH (ref 0.0–40.0)

## 2021-08-25 LAB — HEMOGLOBIN A1C: Hgb A1c MFr Bld: 7.5 % — ABNORMAL HIGH (ref 4.6–6.5)

## 2021-08-25 LAB — PSA: PSA: 0.62 ng/mL (ref 0.10–4.00)

## 2021-08-25 MED ORDER — SILDENAFIL CITRATE 100 MG PO TABS: ORAL_TABLET | ORAL | 11 refills | Status: DC

## 2021-08-25 MED ORDER — METOPROLOL SUCCINATE ER 100 MG PO TB24: 100.0000 mg | ORAL_TABLET | Freq: Every day | ORAL | 3 refills | Status: DC

## 2021-08-25 MED ORDER — LOSARTAN POTASSIUM 100 MG PO TABS: 100.0000 mg | ORAL_TABLET | Freq: Every day | ORAL | 3 refills | Status: DC

## 2021-08-25 NOTE — Progress Notes (Signed)
Subjective:    Patient ID: Steven Solis, male    DOB: [birth year], 51 y.o.   MRN: [id number]  HPI Here for a well exam. He feels well except he has been more tired that usual and his libido has decreased. He asks if we can check his testosterone level. His weight is down 5 lbs from January.    Review of Systems  Constitutional:  Positive for fatigue.  HENT: Negative.    Eyes: Negative.   Respiratory: Negative.    Cardiovascular: Negative.   Gastrointestinal: Negative.   Genitourinary: Negative.   Musculoskeletal:  Positive for arthralgias.  Skin: Negative.   Neurological: Negative.   Psychiatric/Behavioral: Negative.         Objective:   Physical Exam Constitutional:      General: He is not in acute distress.    Appearance: He is well-developed. He is obese. He is not diaphoretic.  HENT:     Head: Normocephalic and atraumatic.     Right Ear: External ear normal.     Left Ear: External ear normal.     Nose: Nose normal.     Mouth/Throat:     Pharynx: No oropharyngeal exudate.  Eyes:     General: No scleral icterus.       Right eye: No discharge.        Left eye: No discharge.     Conjunctiva/sclera: Conjunctivae normal.     Pupils: Pupils are equal, round, and reactive to light.  Neck:     Thyroid: No thyromegaly.     Vascular: No JVD.     Trachea: No tracheal deviation.  Cardiovascular:     Rate and Rhythm: Normal rate and regular rhythm.     Heart sounds: Normal heart sounds. No murmur heard.    No friction rub. No gallop.  Pulmonary:     Effort: Pulmonary effort is normal. No respiratory distress.     Breath sounds: Normal breath sounds. No wheezing or rales.  Chest:     Chest wall: No tenderness.  Abdominal:     General: Bowel sounds are normal. There is no distension.     Palpations: Abdomen is soft. There is no mass.     Tenderness: There is no abdominal tenderness. There is no guarding or rebound.  Genitourinary:    Penis: Normal. No tenderness.       Testes: Normal.     Prostate: Normal.     Rectum: Normal. Guaiac result negative.  Musculoskeletal:        General: No tenderness. Normal range of motion.     Cervical back: Neck supple.  Lymphadenopathy:     Cervical: No cervical adenopathy.  Skin:    General: Skin is warm and dry.     Coloration: Skin is not pale.     Findings: No erythema or rash.  Neurological:     Mental Status: He is alert and oriented to person, place, and time.     Cranial Nerves: No cranial nerve deficit.     Motor: No abnormal muscle tone.     Coordination: Coordination normal.     Deep Tendon Reflexes: Reflexes are normal and symmetric. Reflexes normal.  Psychiatric:        Behavior: Behavior normal.        Thought Content: Thought content normal.        Judgment: Judgment normal.           Assessment & Plan:  Well exam. We discussed diet  and exercise. Get fasting labs, including a testosterone level. Set up a colonoscopy. For the HTN, we will increase the Metoprolol succinate to 100 mg daily.  Gershon Crane, MD

## 2021-08-29 ENCOUNTER — Telehealth: Payer: Self-pay

## 2021-08-29 NOTE — Telephone Encounter (Signed)
Steven Solis - Rx #: [id number] Need help? Call us at [phone] Status Additional Information Required Drug Sildenafil Citrate 100MG  tablets Form [employer] Electronic Request Form (CB) Original Claim Info 76 EXCEEDS MAXIMUM QUANTITY ALLOWEDMAX 4 QUANTITY IN 30 DAYS40

## 2021-08-31 NOTE — Telephone Encounter (Signed)
Outcome Cancelledon June 13 Your request has been cancelled  Will contact the pharmacy to follow up.

## 2021-08-31 NOTE — Telephone Encounter (Signed)
Contact patient's insurance pharmacy([facility]). She inform that PA is not required for the dose that was ask for. It was filled for patient on 08/17/21.

## 2021-11-01 ENCOUNTER — Telehealth: Payer: Self-pay | Admitting: Family Medicine

## 2021-11-01 NOTE — Telephone Encounter (Signed)
Pt is calling back and would like to fix his testosterone issue and would like to go the cheapest route with testosterone medication or injection. Pt seen dr fry in June 2023.  Please advise

## 2021-11-01 NOTE — Telephone Encounter (Signed)
Testosterone Labs on 08/25/21 at 11:42 am.   Please advise

## 2021-11-02 MED ORDER — TESTOSTERONE CYPIONATE 200 MG/ML IM SOLN: 200.0000 mg | INTRAMUSCULAR | 0 refills | Status: DC

## 2021-11-02 MED ORDER — SYRINGE 22G X 1-1/2" 3 ML MISC
1.0000 | 2 refills | Status: DC
Start: 2021-11-02 — End: 2022-06-05

## 2021-11-02 NOTE — Telephone Encounter (Signed)
I sent in for him to start the shots

## 2021-11-02 NOTE — Telephone Encounter (Signed)
Called patient lvm prescription was sent to [facility].

## 2022-01-30 DIAGNOSIS — E790 Hyperuricemia without signs of inflammatory arthritis and tophaceous disease: Secondary | ICD-10-CM | POA: Diagnosis not present

## 2022-01-30 DIAGNOSIS — L4050 Arthropathic psoriasis, unspecified: Secondary | ICD-10-CM | POA: Diagnosis not present

## 2022-01-30 DIAGNOSIS — R5383 Other fatigue: Secondary | ICD-10-CM | POA: Diagnosis not present

## 2022-01-30 DIAGNOSIS — L409 Psoriasis, unspecified: Secondary | ICD-10-CM | POA: Diagnosis not present

## 2022-03-05 DIAGNOSIS — L4050 Arthropathic psoriasis, unspecified: Secondary | ICD-10-CM | POA: Diagnosis not present

## 2022-04-03 DIAGNOSIS — L4050 Arthropathic psoriasis, unspecified: Secondary | ICD-10-CM | POA: Diagnosis not present

## 2022-04-14 ENCOUNTER — Other Ambulatory Visit: Payer: Self-pay | Admitting: Family Medicine

## 2022-04-30 DIAGNOSIS — S46001A Unspecified injury of muscle(s) and tendon(s) of the rotator cuff of right shoulder, initial encounter: Secondary | ICD-10-CM | POA: Diagnosis not present

## 2022-05-28 ENCOUNTER — Telehealth: Payer: Self-pay | Admitting: Family Medicine

## 2022-05-28 NOTE — Telephone Encounter (Signed)
Pt LOV was on 08/25/21 Last refill was done on 04/18/22 Please advise

## 2022-05-28 NOTE — Telephone Encounter (Signed)
Prescription Request  05/28/2022  LOV: 08/25/2021  What is the name of the medication or equipment? testosterone cypionate (DEPOTESTOSTERONE CYPIONATE) 200 MG/ML injection   Have you contacted your pharmacy to request a refill? No   Which pharmacy would you like this sent to?  [id number] - [place], [address] [place] [address] Phone: [phone] Fax: [phone]    Patient notified that their request is being sent to the clinical staff for review and that they should receive a response within 2 business days.   Please advise at Mobile [phone] (mobile)

## 2022-05-29 MED ORDER — TESTOSTERONE CYPIONATE 200 MG/ML IM SOLN: INTRAMUSCULAR | 1 refills | Status: DC

## 2022-05-29 NOTE — Telephone Encounter (Signed)
Done

## 2022-05-31 DIAGNOSIS — L4050 Arthropathic psoriasis, unspecified: Secondary | ICD-10-CM | POA: Diagnosis not present

## 2022-05-31 DIAGNOSIS — E790 Hyperuricemia without signs of inflammatory arthritis and tophaceous disease: Secondary | ICD-10-CM | POA: Diagnosis not present

## 2022-05-31 DIAGNOSIS — L409 Psoriasis, unspecified: Secondary | ICD-10-CM | POA: Diagnosis not present

## 2022-05-31 DIAGNOSIS — R5383 Other fatigue: Secondary | ICD-10-CM | POA: Diagnosis not present

## 2022-06-01 DIAGNOSIS — L4050 Arthropathic psoriasis, unspecified: Secondary | ICD-10-CM | POA: Diagnosis not present

## 2022-06-01 DIAGNOSIS — R5383 Other fatigue: Secondary | ICD-10-CM | POA: Diagnosis not present

## 2022-06-05 ENCOUNTER — Other Ambulatory Visit: Payer: Self-pay

## 2022-06-05 ENCOUNTER — Telehealth: Payer: Self-pay | Admitting: Family Medicine

## 2022-06-05 MED ORDER — "SYRINGE 22G X 1-1/2"" 3 ML MISC": 1.0000 | 2 refills | Status: AC

## 2022-06-05 NOTE — Telephone Encounter (Signed)
Prescription Request  06/05/2022  LOV: 08/25/2021  What is the name of the medication or equipment? Syringe/Needle, Disp, (SYRINGE 3CC/22GX1-1/2") 22G X 1-1/2" 3 ML MISC   Have you contacted your pharmacy to request a refill? No   Which pharmacy would you like this sent to?  [id number] - [place], [address] [place] [address] Phone: [phone] Fax: [phone]    Patient notified that their request is being sent to the clinical staff for review and that they should receive a response within 2 business days.   Please advise at Mobile [phone] (mobile)

## 2022-06-05 NOTE — Telephone Encounter (Signed)
Pt Rx sent 

## 2022-09-17 DIAGNOSIS — L4050 Arthropathic psoriasis, unspecified: Secondary | ICD-10-CM | POA: Diagnosis not present

## 2022-09-25 DIAGNOSIS — L409 Psoriasis, unspecified: Secondary | ICD-10-CM | POA: Diagnosis not present

## 2022-09-25 DIAGNOSIS — E790 Hyperuricemia without signs of inflammatory arthritis and tophaceous disease: Secondary | ICD-10-CM | POA: Diagnosis not present

## 2022-09-25 DIAGNOSIS — R5383 Other fatigue: Secondary | ICD-10-CM | POA: Diagnosis not present

## 2022-09-25 DIAGNOSIS — L4050 Arthropathic psoriasis, unspecified: Secondary | ICD-10-CM | POA: Diagnosis not present

## 2022-10-22 DIAGNOSIS — S43431A Superior glenoid labrum lesion of right shoulder, initial encounter: Secondary | ICD-10-CM | POA: Diagnosis not present

## 2022-10-22 DIAGNOSIS — M7551 Bursitis of right shoulder: Secondary | ICD-10-CM | POA: Diagnosis not present

## 2022-10-22 DIAGNOSIS — M19011 Primary osteoarthritis, right shoulder: Secondary | ICD-10-CM | POA: Diagnosis not present

## 2022-10-22 DIAGNOSIS — S46011A Strain of muscle(s) and tendon(s) of the rotator cuff of right shoulder, initial encounter: Secondary | ICD-10-CM | POA: Diagnosis not present

## 2022-10-22 DIAGNOSIS — M7521 Bicipital tendinitis, right shoulder: Secondary | ICD-10-CM | POA: Diagnosis not present

## 2022-11-12 DIAGNOSIS — L4050 Arthropathic psoriasis, unspecified: Secondary | ICD-10-CM | POA: Diagnosis not present

## 2022-12-21 ENCOUNTER — Other Ambulatory Visit: Payer: Self-pay | Admitting: Family Medicine

## 2022-12-21 DIAGNOSIS — Z1211 Encounter for screening for malignant neoplasm of colon: Secondary | ICD-10-CM

## 2022-12-21 DIAGNOSIS — Z1212 Encounter for screening for malignant neoplasm of rectum: Secondary | ICD-10-CM

## 2023-01-07 ENCOUNTER — Other Ambulatory Visit: Payer: Self-pay | Admitting: Family Medicine

## 2023-01-07 DIAGNOSIS — L4050 Arthropathic psoriasis, unspecified: Secondary | ICD-10-CM | POA: Diagnosis not present

## 2023-01-14 DIAGNOSIS — Z1211 Encounter for screening for malignant neoplasm of colon: Secondary | ICD-10-CM | POA: Diagnosis not present

## 2023-01-14 DIAGNOSIS — Z1212 Encounter for screening for malignant neoplasm of rectum: Secondary | ICD-10-CM | POA: Diagnosis not present

## 2023-01-22 LAB — COLOGUARD: COLOGUARD: NEGATIVE

## 2023-01-30 DIAGNOSIS — Z4789 Encounter for other orthopedic aftercare: Secondary | ICD-10-CM | POA: Diagnosis not present

## 2023-03-04 DIAGNOSIS — L4050 Arthropathic psoriasis, unspecified: Secondary | ICD-10-CM | POA: Diagnosis not present

## 2023-03-04 DIAGNOSIS — Z79899 Other long term (current) drug therapy: Secondary | ICD-10-CM | POA: Diagnosis not present

## 2023-03-07 DIAGNOSIS — M25511 Pain in right shoulder: Secondary | ICD-10-CM | POA: Diagnosis not present

## 2023-03-15 ENCOUNTER — Other Ambulatory Visit: Payer: Self-pay | Admitting: Family Medicine

## 2023-03-15 NOTE — Telephone Encounter (Signed)
Copied from CRM [id number]. Topic: Clinical - Medication Refill >> Mar 15, 2023  9:07 AM Steele Sizer wrote: Most Recent Primary Care Visit:  Provider: Gershon Crane A  Department: [facility]  Visit Type: PHYSICAL  Date: 08/25/2021  Medication: testosterone cypionate  Has the patient contacted their pharmacy? Yes (Agent: If no, request that the patient contact the pharmacy for the refill. If patient does not wish to contact the pharmacy document the reason why and proceed with request.) (Agent: If yes, when and what did the pharmacy advise?)  Is this the correct pharmacy for this prescription? Yes If no, delete pharmacy and type the correct one.  This is the patient's preferred pharmacy:  [id number] - [place], [place] - [address] [place] [address] Phone: [phone] Fax: [phone]   Has the prescription been filled recently? No  Is the patient out of the medication? No  Has the patient been seen for an appointment in the last year OR does the patient have an upcoming appointment? Yes  Can we respond through MyChart? No  Agent: Please be advised that Rx refills may take up to 3 business days. We ask that you follow-up with your pharmacy.

## 2023-03-18 MED ORDER — TESTOSTERONE CYPIONATE 200 MG/ML IM SOLN: INTRAMUSCULAR | 1 refills | Status: DC

## 2023-03-26 DIAGNOSIS — R5383 Other fatigue: Secondary | ICD-10-CM | POA: Diagnosis not present

## 2023-03-26 DIAGNOSIS — L4050 Arthropathic psoriasis, unspecified: Secondary | ICD-10-CM | POA: Diagnosis not present

## 2023-03-26 DIAGNOSIS — E790 Hyperuricemia without signs of inflammatory arthritis and tophaceous disease: Secondary | ICD-10-CM | POA: Diagnosis not present

## 2023-03-26 DIAGNOSIS — L409 Psoriasis, unspecified: Secondary | ICD-10-CM | POA: Diagnosis not present

## 2023-04-01 ENCOUNTER — Other Ambulatory Visit: Payer: Self-pay | Admitting: Family Medicine

## 2023-04-02 ENCOUNTER — Other Ambulatory Visit: Payer: Self-pay | Admitting: Family Medicine

## 2023-04-02 NOTE — Telephone Encounter (Signed)
 Copied from CRM [id number]. Topic: Clinical - Medication Refill >> Apr 02, 2023  2:20 PM Benton KIDD wrote: Most Recent Primary Care Visit:  Provider: JOHNNY SENIOR A  Department: [facility]  Visit Type: PHYSICAL  Date: 08/25/2021  Medication: losartan  (COZAAR ) 100 MG tablet   Has the patient contacted their pharmacy? Yes says the doctotr office denied refill (Agent: If no, request that the patient contact the pharmacy for the refill. If patient does not wish to contact the pharmacy document the reason why and proceed with request.) (Agent: If yes, when and what did the pharmacy advise?)  Is this the correct pharmacy for this prescription? Yes If no, delete pharmacy and type the correct one.  This is the patient's preferred pharmacy:  [id number] - [place], [place] - [address] [place] [address] Phone: [phone] Fax: [phone]   Has the prescription been filled recently? No been a while   Is the patient out of the medication?   Has the patient been seen for an appointment in the last year OR does the patient have an upcoming appointment?   Can we respond through MyChart?   Agent: Please be advised that Rx refills may take up to 3 business days. We ask that you follow-up with your pharmacy.

## 2023-04-04 ENCOUNTER — Other Ambulatory Visit: Payer: Self-pay | Admitting: Family Medicine

## 2023-04-04 ENCOUNTER — Other Ambulatory Visit: Payer: Self-pay

## 2023-04-04 MED ORDER — LOSARTAN POTASSIUM 100 MG PO TABS: 100.0000 mg | ORAL_TABLET | Freq: Every day | ORAL | 0 refills | Status: DC

## 2023-04-04 MED ORDER — METOPROLOL SUCCINATE ER 100 MG PO TB24: 100.0000 mg | ORAL_TABLET | Freq: Every day | ORAL | 0 refills | Status: DC

## 2023-05-02 ENCOUNTER — Ambulatory Visit: Payer: Self-pay | Admitting: Family Medicine

## 2023-05-02 NOTE — Telephone Encounter (Signed)
Chief Complaint: Flu-like symptoms Symptoms: body aches, headache, congestion Frequency: since Monday  Pertinent Negatives: Patient denies fever Disposition: [] ED /[] Urgent Care (no appt availability in office) / [] Appointment(In office/virtual)/ []  [facility]/ [x] Home Care/ [] Refused Recommended Disposition /[] [facility]/ [x]  Follow-up with PCP Additional Notes: Patient called in stating he has been experiencing flu-like symptoms since Monday, after being exposed. Patient is experiencing body aches, headache, and congestion. Patient denies fever. Advised patient on home care at this time.  Patient also states he only has 3 days left of his blood pressure medication (Losartan and Metoprolol Succinate) but patient states Dr. Clent Ridges informed him he wants to see the patient in the office before he refills the medication. Earliest appt for Dr. Clent Ridges is next Wednesday, and since patient has flu he is suggesting he wait until Wednesday to be seen, but is unsure of what to do for time being? Please contact patient at [phone].    Copied from CRM [id number]. Topic: Clinical - Red Word Triage >> May 02, 2023  2:58 PM Denese Killings wrote: Red Word that prompted transfer to Nurse Triage: Patient thinks he has a possible cold/flu. Symptoms- headaches, body aches, and congested nose. Reason for Disposition  [1] Probable influenza (fever) with no complications AND [2] NOT HIGH RISK  Answer Assessment - Initial Assessment Questions 1. WORST SYMPTOM: "What is your worst symptom?" (e.g., cough, runny nose, muscle aches, headache, sore throat, fever)      Body aches 2. ONSET: "When did your flu symptoms start?"      Monday night 3. COUGH: "How bad is the cough?"       Dry cough 4. RESPIRATORY DISTRESS: "Describe your breathing."      Breathing is normal 5. FEVER: "Do you have a fever?" If Yes, ask: "What is your temperature, how was it measured, and when did it start?"     No 6.  EXPOSURE: "Were you exposed to someone with influenza?"       Exposed a little over a week ago 7. FLU VACCINE: "Did you get a flu shot this year?"     No 8. HIGH RISK DISEASE: "Do you have any chronic medical problems?" (e.g., heart or lung disease, asthma, weak immune system, or other HIGH RISK conditions)     No 10. OTHER SYMPTOMS: "Do you have any other symptoms?"  (e.g., runny nose, muscle aches, headache, sore throat)       Body aches, headache, congestion, cough  Protocols used: Influenza - [facility]

## 2023-05-07 DIAGNOSIS — L4050 Arthropathic psoriasis, unspecified: Secondary | ICD-10-CM | POA: Diagnosis not present

## 2023-05-07 DIAGNOSIS — E790 Hyperuricemia without signs of inflammatory arthritis and tophaceous disease: Secondary | ICD-10-CM | POA: Diagnosis not present

## 2023-05-08 ENCOUNTER — Ambulatory Visit: Payer: BC Managed Care – PPO | Admitting: Family Medicine

## 2023-05-10 ENCOUNTER — Encounter: Payer: Self-pay | Admitting: Family Medicine

## 2023-05-10 ENCOUNTER — Ambulatory Visit: Payer: BC Managed Care – PPO | Admitting: Family Medicine

## 2023-05-10 VITALS — BP 148/90 | HR 55 | Temp 98.5°F | Ht 70.0 in | Wt 334.0 lb

## 2023-05-10 DIAGNOSIS — I10 Essential (primary) hypertension: Secondary | ICD-10-CM

## 2023-05-10 DIAGNOSIS — Z125 Encounter for screening for malignant neoplasm of prostate: Secondary | ICD-10-CM

## 2023-05-10 DIAGNOSIS — N529 Male erectile dysfunction, unspecified: Secondary | ICD-10-CM | POA: Diagnosis not present

## 2023-05-10 DIAGNOSIS — Z0000 Encounter for general adult medical examination without abnormal findings: Secondary | ICD-10-CM

## 2023-05-10 DIAGNOSIS — E291 Testicular hypofunction: Secondary | ICD-10-CM | POA: Diagnosis not present

## 2023-05-10 DIAGNOSIS — K429 Umbilical hernia without obstruction or gangrene: Secondary | ICD-10-CM

## 2023-05-10 LAB — LIPID PANEL
Cholesterol: 156 mg/dL (ref 0–200)
HDL: 39.2 mg/dL (ref >39.00)
LDL Cholesterol: 85 mg/dL (ref 0–99)
NonHDL: 116.87
Total CHOL/HDL Ratio: 4
Triglycerides: 159 mg/dL — ABNORMAL HIGH (ref 0.0–149.0)
VLDL: 31.8 mg/dL (ref 0.0–40.0)

## 2023-05-10 LAB — BASIC METABOLIC PANEL
BUN: 22 mg/dL (ref 6–23)
CO2: 30 meq/L (ref 19–32)
Calcium: 9.2 mg/dL (ref 8.4–10.5)
Chloride: 104 meq/L (ref 96–112)
Creatinine, Ser: 1.16 mg/dL (ref 0.40–1.50)
GFR: 72.06 mL/min (ref >60.00)
Glucose, Bld: 122 mg/dL — ABNORMAL HIGH (ref 70–99)
Potassium: 4.3 meq/L (ref 3.5–5.1)
Sodium: 142 meq/L (ref 135–145)

## 2023-05-10 LAB — HEPATIC FUNCTION PANEL
ALT: 20 U/L (ref 0–53)
AST: 13 U/L (ref 0–37)
Albumin: 4.5 g/dL (ref 3.5–5.2)
Alkaline Phosphatase: 55 U/L (ref 39–117)
Bilirubin, Direct: 0.2 mg/dL (ref 0.0–0.3)
Total Bilirubin: 1.4 mg/dL — ABNORMAL HIGH (ref 0.2–1.2)
Total Protein: 7.1 g/dL (ref 6.0–8.3)

## 2023-05-10 LAB — CBC WITH DIFFERENTIAL/PLATELET
Basophils Absolute: 0 10*3/uL (ref 0.0–0.1)
Basophils Relative: 0.5 % (ref 0.0–3.0)
Eosinophils Absolute: 0.2 10*3/uL (ref 0.0–0.7)
Eosinophils Relative: 2.4 % (ref 0.0–5.0)
HCT: 47 % (ref 39.0–52.0)
Hemoglobin: 16.3 g/dL (ref 13.0–17.0)
Lymphocytes Relative: 36.7 % (ref 12.0–46.0)
Lymphs Abs: 2.9 10*3/uL (ref 0.7–4.0)
MCHC: 34.7 g/dL (ref 30.0–36.0)
MCV: 85.8 fL (ref 78.0–100.0)
Monocytes Absolute: 0.5 10*3/uL (ref 0.1–1.0)
Monocytes Relative: 6.6 % (ref 3.0–12.0)
Neutro Abs: 4.3 10*3/uL (ref 1.4–7.7)
Neutrophils Relative %: 53.8 % (ref 43.0–77.0)
Platelets: 189 10*3/uL (ref 150.0–400.0)
RBC: 5.49 Mil/uL (ref 4.22–5.81)
RDW: 13.6 % (ref 11.5–15.5)
WBC: 8 10*3/uL (ref 4.0–10.5)

## 2023-05-10 LAB — HEMOGLOBIN A1C: Hgb A1c MFr Bld: 5.7 % (ref 4.6–6.5)

## 2023-05-10 LAB — PSA: PSA: 0.68 ng/mL (ref 0.10–4.00)

## 2023-05-10 LAB — TSH: TSH: 1.24 u[IU]/mL (ref 0.35–5.50)

## 2023-05-10 MED ORDER — LOSARTAN POTASSIUM 100 MG PO TABS: 100.0000 mg | ORAL_TABLET | Freq: Every day | ORAL | 3 refills | Status: DC

## 2023-05-10 MED ORDER — HYDRALAZINE HCL 100 MG PO TABS: 100.0000 mg | ORAL_TABLET | Freq: Two times a day (BID) | ORAL | 2 refills | Status: DC

## 2023-05-10 MED ORDER — TESTOSTERONE CYPIONATE 200 MG/ML IM SOLN
200.0000 mg | INTRAMUSCULAR | 5 refills | Status: DC
Start: 2023-05-10 — End: 2023-11-22

## 2023-05-10 MED ORDER — TADALAFIL 20 MG PO TABS: 20.0000 mg | ORAL_TABLET | Freq: Every day | ORAL | 11 refills | Status: AC | PRN

## 2023-05-10 MED ORDER — METOPROLOL SUCCINATE ER 100 MG PO TB24
100.0000 mg | ORAL_TABLET | Freq: Two times a day (BID) | ORAL | 3 refills | Status: DC
Start: 2023-05-10 — End: 2024-01-16

## 2023-05-10 NOTE — Telephone Encounter (Signed)
Pt was seen by Dr Clent Ridges today for this problem

## 2023-05-10 NOTE — Progress Notes (Signed)
Subjective:    Patient ID: Steven Solis, male    DOB: 1970/12/18, 53 y.o.   MRN: [id number]  HPI Here for a well exam. He feels well in general. He does note that his libido has been poor lately. He takes testosterone shots every 14 days. He rarely checks his BP.    Review of Systems  Constitutional: Negative.   HENT: Negative.    Eyes: Negative.   Respiratory: Negative.    Cardiovascular: Negative.   Gastrointestinal: Negative.   Genitourinary: Negative.   Musculoskeletal: Negative.   Skin: Negative.   Neurological: Negative.   Psychiatric/Behavioral: Negative.         Objective:   Physical Exam Constitutional:      General: He is not in acute distress.    Appearance: He is well-developed. He is obese. He is not diaphoretic.  HENT:     Head: Normocephalic and atraumatic.     Right Ear: External ear normal.     Left Ear: External ear normal.     Nose: Nose normal.     Mouth/Throat:     Pharynx: No oropharyngeal exudate.  Eyes:     General: No scleral icterus.       Right eye: No discharge.        Left eye: No discharge.     Conjunctiva/sclera: Conjunctivae normal.     Pupils: Pupils are equal, round, and reactive to light.  Neck:     Thyroid: No thyromegaly.     Vascular: No JVD.     Trachea: No tracheal deviation.  Cardiovascular:     Rate and Rhythm: Normal rate and regular rhythm.     Pulses: Normal pulses.     Heart sounds: Normal heart sounds. No murmur heard.    No friction rub. No gallop.  Pulmonary:     Effort: Pulmonary effort is normal. No respiratory distress.     Breath sounds: Normal breath sounds. No wheezing or rales.  Chest:     Chest wall: No tenderness.  Abdominal:     General: Bowel sounds are normal. There is no distension.     Palpations: Abdomen is soft. There is no mass.     Tenderness: There is no abdominal tenderness. There is no guarding or rebound.     Comments: Moderate sized non-tender umbilical hernia that is easily  reducible   Genitourinary:    Penis: Normal. No tenderness.      Testes: Normal.     Prostate: Normal.     Rectum: Normal. Guaiac result negative.  Musculoskeletal:        General: No tenderness. Normal range of motion.     Cervical back: Neck supple.  Lymphadenopathy:     Cervical: No cervical adenopathy.  Skin:    General: Skin is warm and dry.     Coloration: Skin is not pale.     Findings: No erythema or rash.  Neurological:     General: No focal deficit present.     Mental Status: He is alert and oriented to person, place, and time.     Cranial Nerves: No cranial nerve deficit.     Motor: No abnormal muscle tone.     Coordination: Coordination normal.     Deep Tendon Reflexes: Reflexes are normal and symmetric. Reflexes normal.  Psychiatric:        Mood and Affect: Mood normal.        Behavior: Behavior normal.        Thought  Content: Thought content normal.        Judgment: Judgment normal.           Assessment & Plan:  Well exam. We discussed diet and exercise. Get fasting labs. For his HTN, he will stay on Losartan 100 mg daily. We will increase the Metoprolol succinate to 100 mg BID. We will also start him on Hydralazine 100 mg BID. He will check his BP at home and report back in 3-4 weeks. For the libido, he will increase the testosterone shots to every 7 days. We will check a level today. For the ED, he will stop Viagra and will try Cialis 20 mg as needed. Gershon Crane, MD

## 2023-05-14 DIAGNOSIS — H5203 Hypermetropia, bilateral: Secondary | ICD-10-CM | POA: Diagnosis not present

## 2023-05-14 DIAGNOSIS — H52203 Unspecified astigmatism, bilateral: Secondary | ICD-10-CM | POA: Diagnosis not present

## 2023-05-14 DIAGNOSIS — H11003 Unspecified pterygium of eye, bilateral: Secondary | ICD-10-CM | POA: Diagnosis not present

## 2023-07-13 ENCOUNTER — Other Ambulatory Visit: Payer: Self-pay | Admitting: Family Medicine

## 2023-10-04 LAB — LAB REPORT - SCANNED
EGFR: 74
HM Hepatitis Screen: NEGATIVE

## 2023-10-07 ENCOUNTER — Ambulatory Visit: Payer: Self-pay

## 2023-10-07 NOTE — Telephone Encounter (Signed)
 FYI Only or Action Required?: FYI only for provider.  Patient was last seen in primary care on 05/10/2023 by Steven Solis LABOR, MD.  Called Nurse Triage reporting Hypertension.  Symptoms began several weeks ago.  Interventions attempted: Prescription medications: metoprolol  and losartan .  Symptoms are: unchanged.  Triage Disposition: See Physician Within 24 Hours  Patient/caregiver understands and will follow disposition?: Yes  Pt states for about 1.5 weeks BP has been elevated and had dull achy HA. Has been taking Tylenol but not helping. Pt is currently out of town on job site, recommended going to UC while there and then keeping appt with PCP on 10/14/23. Encouraged pt to drink plenty of fluids and can try Excedrin for HA. Pt verbalized understanding.   Copied from CRM [id number]. Topic: Clinical - Red Word Triage >> Oct 07, 2023 10:21 AM Chiquita SQUIBB wrote: Red Word that prompted transfer to Nurse Triage: Patient is calling due to high BP that is causing him to have headaches every day, and is asking if anything will help with it. Reason for Disposition  Systolic BP >= 180 OR Diastolic >= 110  Answer Assessment - Initial Assessment Questions 1. BLOOD PRESSURE: What is your blood pressure? Did you take at least two measurements 5 minutes apart?     Fri night 185/114, Sat 189/122, and Sun 195/120 2. ONSET: When did you take your blood pressure?     1.5 weeks  3. HOW: How did you take your blood pressure? (e.g., automatic home BP monitor, visiting nurse)     Home BP 4. HISTORY: Do you have a history of high blood pressure?     yes 5. MEDICINES: Are you taking any medicines for blood pressure? Have you missed any doses recently?     Yes and haven't missed any doses 6. OTHER SYMPTOMS: Do you have any symptoms? (e.g., blurred vision, chest pain, difficulty breathing, headache, weakness)     Constant dull HA 7/10  Tylenol 500mg  2 tabs at least BID  Protocols used: Blood  Pressure - High-A-AH

## 2023-10-10 ENCOUNTER — Telehealth: Payer: Self-pay | Admitting: *Deleted

## 2023-10-10 NOTE — Telephone Encounter (Signed)
 Copied from CRM [id number]. Topic: Clinical - Medical Advice >> Oct 07, 2023 10:19 AM Steven Solis wrote: Reason for CRM: Patient is calling in because he does not feel his blood pressure medication is working as it should. Patient states he has been having some high BP recently that is causing headaches. Patient is asking if he can take something else.

## 2023-10-14 ENCOUNTER — Ambulatory Visit (INDEPENDENT_AMBULATORY_CARE_PROVIDER_SITE_OTHER): Admitting: Family Medicine

## 2023-10-14 ENCOUNTER — Encounter: Payer: Self-pay | Admitting: Family Medicine

## 2023-10-14 VITALS — BP 144/92 | HR 55 | Temp 98.2°F | Wt 340.0 lb

## 2023-10-14 DIAGNOSIS — E291 Testicular hypofunction: Secondary | ICD-10-CM

## 2023-10-14 DIAGNOSIS — I10 Essential (primary) hypertension: Secondary | ICD-10-CM | POA: Diagnosis not present

## 2023-10-14 DIAGNOSIS — E1165 Type 2 diabetes mellitus with hyperglycemia: Secondary | ICD-10-CM | POA: Diagnosis not present

## 2023-10-14 MED ORDER — VERAPAMIL HCL ER 180 MG PO CP24: 180.0000 mg | ORAL_CAPSULE | Freq: Every day | ORAL | 5 refills | Status: DC

## 2023-10-14 NOTE — Progress Notes (Signed)
   Subjective:    Patient ID: Steven Solis, male    DOB: 03/28/1970, 53 y.o.   MRN: [id number]  HPI Here for elevated BP readings. Over the past 3 weeks his BP has been running high, averaging 150's over 90's. On several occasions his systolic pressure went over 200. He gets headaches when the BP goes so high, but he denies chest pain or SOB. We also talked about his obesity and its effects on HTN. He asks for help with losing weight.    Review of Systems  Constitutional: Negative.   Respiratory: Negative.    Cardiovascular: Negative.   Neurological:  Positive for headaches.       Objective:   Physical Exam Constitutional:      Appearance: He is obese.  Cardiovascular:     Rate and Rhythm: Normal rate and regular rhythm.     Pulses: Normal pulses.     Heart sounds: Normal heart sounds.  Pulmonary:     Effort: Pulmonary effort is normal.     Breath sounds: Normal breath sounds.  Musculoskeletal:     Right lower leg: No edema.     Left lower leg: No edema.  Neurological:     Mental Status: He is alert.           Assessment & Plan:  His HTN has been poorly controlled. We will stop Hydralazine  and instead start him on Verapamil  CR 180 mg daily. He will check back in 3 weeks. Also he has type 2 diabetes, so his insurance should cover at least one of the GLP-1 medications. This would be an excellent way to help him lose weight. He will check to see which of these medications his insurance will cover. Garnette Olmsted, MD

## 2023-10-14 NOTE — Telephone Encounter (Signed)
 He was seen here this morning

## 2023-10-15 LAB — TESTOSTERONE: Testosterone: 745.05 ng/dL (ref 300.00–890.00)

## 2023-10-16 ENCOUNTER — Ambulatory Visit: Payer: Self-pay | Admitting: Family Medicine

## 2023-10-24 ENCOUNTER — Telehealth: Payer: Self-pay | Admitting: *Deleted

## 2023-10-24 MED ORDER — TIRZEPATIDE 2.5 MG/0.5ML ~~LOC~~ SOAJ
2.5000 mg | SUBCUTANEOUS | 3 refills | Status: DC
Start: 2023-10-24 — End: 2023-11-21

## 2023-10-24 NOTE — Addendum Note (Signed)
 Addended by: JOHNNY SENIOR A on: 10/24/2023 08:57 AM   Modules accepted: Orders

## 2023-10-24 NOTE — Telephone Encounter (Signed)
 Noted

## 2023-10-24 NOTE — Telephone Encounter (Signed)
 Copied from CRM [id number]. Topic: Clinical - Lab/Test Results >> Oct 17, 2023  3:43 PM Robinson H wrote: Reason for CRM: Patient returned call to clinic regarding lab results, gave patient message from notes as stated, patient acknowledged.  Patient also wanted to notify provider insurance will cover Mounjaro .  Alm

## 2023-11-21 ENCOUNTER — Telehealth: Payer: Self-pay

## 2023-11-21 ENCOUNTER — Other Ambulatory Visit: Payer: Self-pay | Admitting: Family Medicine

## 2023-11-21 MED ORDER — TIRZEPATIDE 5 MG/0.5ML ~~LOC~~ SOAJ
5.0000 mg | SUBCUTANEOUS | 5 refills | Status: DC
Start: 2023-11-21 — End: 2024-01-14

## 2023-11-21 NOTE — Telephone Encounter (Signed)
 Left detailed message for pt to pick up Rx for Mounjaro  from his pharmacy

## 2023-11-21 NOTE — Telephone Encounter (Signed)
 I sent in the next dose up (5 mg weekly)

## 2023-11-21 NOTE — Telephone Encounter (Signed)
 Copied from CRM #[id number]. Topic: Clinical - Medication Question >> Nov 20, 2023  9:02 AM Burnard DEL wrote: Reason for CRM: Patient was advised to call in after finishing first dosage of mounjaro ,and that he would be prescribed the next dosage. Patient has finished and would like prescription to be sent to pharmacy for next dosage.   [id number] - [place], [place] - [address]  Phone: [phone] Fax: [phone]

## 2024-01-14 ENCOUNTER — Telehealth: Payer: Self-pay | Admitting: *Deleted

## 2024-01-14 MED ORDER — TIRZEPATIDE 7.5 MG/0.5ML ~~LOC~~ SOAJ: 7.5000 mg | SUBCUTANEOUS | 2 refills | Status: AC

## 2024-01-14 NOTE — Addendum Note (Signed)
 Addended by: JOHNNY SENIOR A on: 01/14/2024 04:53 PM   Modules accepted: Orders

## 2024-01-14 NOTE — Telephone Encounter (Signed)
 I sent in the Mounjaro  but I increased this to 7.5  mg weekly

## 2024-01-14 NOTE — Telephone Encounter (Signed)
 Copied from CRM [id number]. Topic: Clinical - Medication Question >> Jan 14, 2024  1:30 PM Mercedes MATSU wrote: Reason for CRM: Patient called in to request a refill on his tirzepatide  (MOUNJARO ) 5 MG/0.5ML Pen, but he wanted to know if it needed to be increased first or if he should continue the same dosage. Patient can be reached at [phone].

## 2024-01-15 NOTE — Telephone Encounter (Signed)
 Pt notified to pick up Rx for Mounjaro 

## 2024-01-16 ENCOUNTER — Other Ambulatory Visit: Payer: Self-pay | Admitting: Family Medicine

## 2024-01-22 ENCOUNTER — Telehealth: Payer: Self-pay

## 2024-01-22 ENCOUNTER — Other Ambulatory Visit: Payer: Self-pay | Admitting: Family Medicine

## 2024-01-22 NOTE — Telephone Encounter (Signed)
 Copied from CRM [id number]. Topic: Clinical - Medication Prior Auth >> Jan 22, 2024  1:18 PM Anairis L wrote: Reason for CRM: New insurance as of nov 1, tirzepatide  (MOUNJARO ) 7.5 MG/0.5ML Pe now required authorization.

## 2024-01-23 ENCOUNTER — Other Ambulatory Visit (HOSPITAL_COMMUNITY): Payer: Self-pay

## 2024-01-23 ENCOUNTER — Telehealth: Payer: Self-pay

## 2024-01-23 NOTE — Telephone Encounter (Signed)
 Pharmacy Patient Advocate Encounter   Received notification from Onbase that prior authorization for Mounjaro  7.5 is required/requested.   Unable to verify insurance. Called Tucker from insurance card in patient media. Spoke with rep who said the Viktor[place] was the secondary medical supplemental insurance. Patient should have pharmacy benefit card. Unable to find any info from patient record. Please advise

## 2024-01-27 ENCOUNTER — Telehealth: Payer: Self-pay | Admitting: *Deleted

## 2024-01-27 NOTE — Telephone Encounter (Signed)
 Copied from CRM #[id number]. Topic: Clinical - Medication Prior Auth >> Jan 27, 2024 10:57 AM Berneda FALCON wrote: Reason for CRM: Patient states that he has [employer] as his primary and [employer]uck[employer] as his secondary so that we can submit his prior auth for his mounjaro .  Please note, he does NOT want this sent to his normal [facility], but would like the one in [place], please.  [facility] [phone] - [place], [place] - [address] AT [facility] [address] [place] [address] Phone: [phone] Fax: [phone] Hours: Not open 24 hours  Patient callback is [phone] (home) [phone] (work)

## 2024-01-28 ENCOUNTER — Other Ambulatory Visit (HOSPITAL_COMMUNITY): Payer: Self-pay

## 2024-01-28 NOTE — Telephone Encounter (Signed)
 Patient has a new online AI enabled PBM. We were unable to submit a PA request in the traditional way. I have created an account, as soon as it is verified I can proceed with the PA request.

## 2024-01-31 ENCOUNTER — Telehealth: Payer: Self-pay | Admitting: *Deleted

## 2024-01-31 NOTE — Telephone Encounter (Signed)
 Copied from CRM #[id number]. Topic: Clinical - Medication Prior Auth >> Jan 31, 2024  2:08 PM Tinnie BROCKS wrote: Reason for CRM: Pt called about prior auth for mounjaro , stated he got new [employer]. IT looks like the new insurance was received. Requesting update at #[id number]

## 2024-01-31 NOTE — Telephone Encounter (Signed)
 Noted

## 2024-02-03 ENCOUNTER — Other Ambulatory Visit (HOSPITAL_COMMUNITY): Payer: Self-pay

## 2024-02-07 ENCOUNTER — Other Ambulatory Visit (HOSPITAL_COMMUNITY): Payer: Self-pay

## 2024-02-07 NOTE — Telephone Encounter (Signed)
 Noted

## 2024-02-07 NOTE — Telephone Encounter (Signed)
 Additional information has been requested from the patient's insurance in order to proceed with the prior authorization request. Requested information has been sent, or form has been filled out and faxed back to [phone]

## 2024-02-10 ENCOUNTER — Other Ambulatory Visit (HOSPITAL_COMMUNITY): Payer: Self-pay

## 2024-02-17 ENCOUNTER — Other Ambulatory Visit (HOSPITAL_COMMUNITY): Payer: Self-pay

## 2024-02-17 ENCOUNTER — Telehealth: Payer: Self-pay

## 2024-02-17 NOTE — Telephone Encounter (Signed)
 Pharmacy Patient Advocate Encounter   Received notification from Onbase that prior authorization for Testosterone  Cyp 200 is required/requested.   Insurance verification completed.   The patient is insured through [employer].   Called insurance and spoke with Tinnie [phone] to initiate PA request. Awaiting faxed forms Ticket # [phone]

## 2024-02-17 NOTE — Telephone Encounter (Signed)
 Additional information has been requested from the patient's insurance in order to proceed with the prior authorization request. Requested information has been sent, or form has been filled out and faxed back to [phone]

## 2024-02-17 NOTE — Telephone Encounter (Signed)
 Pharmacy Patient Advocate Encounter  Received notification from [employer] that Prior Authorization for Mounjaro  has been DENIED.  Full denial letter will be uploaded to the media tab. See denial reason below.    Last patient A1C was 05/10/23.

## 2024-02-18 ENCOUNTER — Other Ambulatory Visit (HOSPITAL_COMMUNITY): Payer: Self-pay

## 2024-02-18 MED ORDER — TESTOSTERONE CYPIONATE 200 MG/ML IM SOLN: 200.0000 mg | INTRAMUSCULAR | 1 refills | Status: AC

## 2024-02-18 NOTE — Telephone Encounter (Signed)
 Pharmacy Patient Advocate Encounter  Received notification from [facility] that Prior Authorization for Testosterone  Cyp 200 has been APPROVED from 02/18/24 to 02/17/25. Ran test claim, Copay is $50.00 for a 70 day supply. This test claim was processed through [facility] copay amounts may vary at other pharmacies due to pharmacy/plan contracts, or as the patient moves through the different stages of their insurance plan.   PA #/Case ID/Reference #: [id number]

## 2024-02-18 NOTE — Telephone Encounter (Signed)
 Noted

## 2024-02-18 NOTE — Telephone Encounter (Signed)
 Done

## 2024-02-18 NOTE — Telephone Encounter (Signed)
 PA for Testosterone  has been approved,Pt needs a refill for Testosterone  sent to the pharmacy

## 2024-03-03 ENCOUNTER — Telehealth: Payer: Self-pay | Admitting: *Deleted

## 2024-03-03 DIAGNOSIS — E1165 Type 2 diabetes mellitus with hyperglycemia: Secondary | ICD-10-CM

## 2024-03-03 NOTE — Telephone Encounter (Signed)
 Copied from CRM [id number]. Topic: Clinical - Prescription Issue >> Feb 24, 2024  8:44 AM [name] wrote: Reason for CRM: Pt requested a call back at [phone] to discuss that his new Insurance is requiring updated A1C labs before they will approve Mounjaro .

## 2024-03-03 NOTE — Telephone Encounter (Signed)
I ordered the A1c

## 2024-03-03 NOTE — Telephone Encounter (Signed)
 Would you like an office visit or okay to order lab?

## 2024-03-03 NOTE — Telephone Encounter (Signed)
 Copied from CRM #[id number]. Topic: Clinical - Request for Lab/Test Order >> Feb 28, 2024  4:16 PM Tinnie C wrote: Reason for CRM: Pt calling back regarding request from 12/8 to get A1c labs completed. He needs A1c drawn due to insurance denying mounjaro  PA due to no recent A1c numbers. Please call pt back to schedule labs at [phone]

## 2024-03-04 NOTE — Telephone Encounter (Signed)
 Pt already has a lab appointment for A1C recheck for his Mounjaro  Rx approval

## 2024-03-04 NOTE — Telephone Encounter (Signed)
 Patient is aware and lab appointment scheduled

## 2024-03-06 ENCOUNTER — Other Ambulatory Visit

## 2024-03-06 ENCOUNTER — Ambulatory Visit: Payer: Self-pay

## 2024-03-06 ENCOUNTER — Ambulatory Visit: Payer: Self-pay | Admitting: Family Medicine

## 2024-03-06 ENCOUNTER — Telehealth: Admitting: Nurse Practitioner

## 2024-03-06 DIAGNOSIS — J329 Chronic sinusitis, unspecified: Secondary | ICD-10-CM | POA: Diagnosis not present

## 2024-03-06 DIAGNOSIS — E1165 Type 2 diabetes mellitus with hyperglycemia: Secondary | ICD-10-CM | POA: Diagnosis not present

## 2024-03-06 LAB — HEMOGLOBIN A1C: Hgb A1c MFr Bld: 5 % (ref 4.6–6.5)

## 2024-03-06 MED ORDER — FLUTICASONE PROPIONATE 50 MCG/ACT NA SUSP: 2.0000 | Freq: Every day | NASAL | 6 refills | Status: AC

## 2024-03-06 MED ORDER — DOXYCYCLINE HYCLATE 100 MG PO TABS: 100.0000 mg | ORAL_TABLET | Freq: Two times a day (BID) | ORAL | 0 refills | Status: DC

## 2024-03-06 NOTE — Telephone Encounter (Signed)
 FYI Only or Action Required?: FYI only for provider: appointment scheduled on 03/06/2024 virtual visit.  Patient was last seen in primary care on 10/14/2023 by Steven Solis LABOR, MD.  Called Nurse Triage reporting Facial Pain and Otalgia.  Symptoms began about a month ago.  Interventions attempted: OTC medications: saline spray .  Symptoms are: unchanged.  Triage Disposition: See Physician Within 24 Hours  Patient/caregiver understands and will follow disposition?: Yes    Reason for Disposition  Earache  Answer Assessment - Initial Assessment Questions 1. LOCATION: Where does it hurt?      Pain in right ear,  pain above eyes  2. ONSET: When did the sinus pain start?  (e.g., hours, days)      Pain above eyes started today  3. SEVERITY: How bad is the pain?   (Scale 0-10; or none, mild, moderate or severe)     Right ear pain gets pretty severe at times 4. RECURRENT SYMPTOM: Have you ever had sinus problems before? If Yes, ask: When was the last time? and What happened that time?      Unsure  5. NASAL CONGESTION: Is the nose blocked? If Yes, ask: Can you open it or must you breathe through your mouth?     Yes nose blocked congestion, able to breath out of mouth normally  6. NASAL DISCHARGE: Do you have discharge from your nose? If so ask, What color?     Yes mixed with blood, no pure blood , not on blood thinner  7. FEVER: Do you have a fever? If Yes, ask: What is it, how was it measured, and when did it start?      Denies  8. OTHER SYMPTOMS: Do you have any other symptoms? (e.g., sore throat, cough, earache, difficulty breathing)     Right ear pain intermittent , occasional cough  Denies difficulty breathing, chest pain, fever, vomiting  Protocols used: Sinus Pain or Congestion-A-AH Copied from CRM #[id number]. Topic: Clinical - Red Word Triage >> Mar 06, 2024  9:06 AM [name] wrote: Reason for CRM: severe sinus infection and has been blowing blood for  three weeks

## 2024-03-06 NOTE — Progress Notes (Addendum)
" ° °  Established Patient Office Visit  An audio-only tele-health visit was completed today for this patient. I connected with  Steven Solis on 03/06/2024 utilizing audio-only technology and verified that I am speaking with the correct person using two identifiers. The patient was located at their home, and I was located at the office of [facility] at [facility] during the encounter. I discussed the limitations of evaluation and management by telemedicine. The patient expressed understanding and agreed to proceed.   *attempted audio/video visit but technical failure occurred during visit in which patient was unable to be seen/heard on caregility application. Thus visit was transitioned to audio-only  Subjective   Patient ID: Steven Solis, male    DOB: January 12, 1971  Age: 53 y.o. MRN: [id number]  Chief Complaint  Patient presents with   Sinus Problem    Discussed the use of AI scribe software for clinical note transcription with the patient, who gave verbal consent to proceed.  History of Present Illness Steven Solis is a 53 year old male who presents with sinus symptoms persisting for a month.  Sinus congestion and nasal symptoms - Sinus congestion persisting for approximately one month - Uses [facility] sinus tablets and oxymetazoline nasal spray every 12 hours - Oxymetazoline nasal spray provides relief for about 12 hours before congestion recurs - Nasal discharge present with mucus and blood  Headache - Persistent headache today  Cough - Occasional dry cough  Constitutional symptoms - No fever or chills      Review of Systems  Constitutional:  Negative for chills and fever.  HENT:  Positive for congestion and sinus pain.   Respiratory:  Positive for cough. Negative for sputum production.   Cardiovascular:  Negative for chest pain.  Neurological:  Positive for headaches.      Objective:     There were no vitals taken for this visit.   Physical  Exam Comprehensive physical exam not completed today as office visit was conducted remotely.  No signs of acute respiratory distress.  Patient was alert and oriented, and appeared to have appropriate judgment.       The 10-year ASCVD risk score (Arnett DK, et al., 2019) is: 12.7%    Assessment & Plan:   Problem List Items Addressed This Visit       Respiratory   Sinusitis - Primary   Relevant Medications   doxycycline  (VIBRA -TABS) 100 MG tablet   fluticasone  (FLONASE ) 50 MCG/ACT nasal spray   Assessment and Plan Assessment & Plan Acute bacterial sinusitis Symptoms suggest secondary bacterial infection due to prolonged duration. Doxycycline  chosen for broad-spectrum coverage and previous tolerance. - Prescribed doxycycline  100 mg, one tablet twice a day for 10 days. - Advised to contact Doctor Johnny if symptoms do not improve or if ENT referral is needed.  Rebound nasal congestion due to topical decongestant overuse Rebound congestion likely from overuse of oxymetazoline nasal spray for over three days. - Discontinued oxymetazoline nasal spray. - Prescribed Flonase  nasal spray to manage congestion during transition off oxymetazoline. - Advised to contact Doctor Johnny if severe congestion persists for potential ENT referral.  Total time spent on audio visit was 5 minutes Return if symptoms worsen or fail to improve.    Lauraine FORBES Pereyra, NP  "

## 2024-03-06 NOTE — Telephone Encounter (Signed)
 Noted

## 2024-04-02 ENCOUNTER — Ambulatory Visit: Payer: Self-pay

## 2024-04-02 NOTE — Telephone Encounter (Signed)
 FYI Only or Action Required?: Action required by provider: update on patient condition and new antibiotic request, declines follow up appointment due to [place].  Patient was last seen in primary care on 03/06/2024 by Elnor Lauraine BRAVO, NP.  Called Nurse Triage reporting Sinusitis.  Symptoms began about a month ago.  Interventions attempted: OTC medications: Mucinex, Prescription medications: finished doxycycline  one week ago, and Other: saline nasal rinse.  Symptoms are: improved but not resolved on doxy, now pressure returning .  Triage Disposition: See PCP When Office is Open (Within 3 Days)  Patient/caregiver understands and will follow disposition?: No, wishes to speak with PCP Reason for Disposition  [1] Sinus congestion (pressure, fullness) AND [2] present > 10 days  Answer Assessment - Initial Assessment Questions Patient calling in to request another round of antibiotics for sinus symptoms that improved on antibiotic, never resolved. Now feels pressure worsening one week after completing doxycycline . He is using saline rinse with relief, Mucinex with relief of post nasal drip. He declines a follow up appointment due to being In [place]  for work, he is requesting another antibiotic to [facility], he states his wife will pick up rx and drive to his work site. Please advise.    1. LOCATION: Where does it hurt?      Sinuses  2. ONSET: When did the sinus pain start?  (e.g., hours, days)      Over one month  3. SEVERITY: How bad is the pain?   (Scale 0-10; or none, mild, moderate or severe)     Getting worse again 4. RECURRENT SYMPTOM: Have you ever had sinus problems before? If Yes, ask: When was the last time? and What happened that time?      yes 5. NASAL CONGESTION: Is the nose blocked? If Yes, ask: Can you open it or must you breathe through your mouth?     yes 6. NASAL DISCHARGE: Do you have discharge from your nose? If so ask, What  color?     Slight-post nasals 7. FEVER: Do you have a fever? If Yes, ask: What is it, how was it measured, and when did it start?      Denies  8. OTHER SYMPTOMS: Do you have any other symptoms? (e.g., sore throat, cough, earache, difficulty breathing)     Mild sinus pressure headache. Denies cough, denies breathing difficulty. Mucinex keeping him from being stopped up, helping with post nasal drip.  Protocols used: Sinus Pain or Congestion-A-AH Copied from CRM #[id number]. Topic: Clinical - Red Word Triage >> Apr 02, 2024  4:13 PM Wess RAMAN wrote: Red Word that prompted transfer to Nurse Triage: Sinus infection more than a month now. Out of doxycycline  (VIBRA -TABS) 100 MG tablet and has taken it for 10 days. Pressure in head, congestion  Pharmacy: [id number] - [place], [place] - [address] [place] [address] Phone: [phone] Fax: [phone] Hours: Not open 24 hours

## 2024-04-03 ENCOUNTER — Other Ambulatory Visit: Payer: Self-pay | Admitting: Family Medicine

## 2024-04-03 MED ORDER — AMOXICILLIN-POT CLAVULANATE 875-125 MG PO TABS: 1.0000 | ORAL_TABLET | Freq: Two times a day (BID) | ORAL | 0 refills | Status: AC

## 2024-04-03 NOTE — Progress Notes (Signed)
 Done

## 2024-04-03 NOTE — Telephone Encounter (Signed)
 I sent in a RX for Augmentin 

## 2024-04-03 NOTE — Addendum Note (Signed)
 Addended by: JOHNNY SENIOR A on: 04/03/2024 03:14 PM   Modules accepted: Orders

## 2024-04-21 ENCOUNTER — Other Ambulatory Visit: Payer: Self-pay | Admitting: Family Medicine
